# Patient Record
Sex: Female | Born: 1945 | Race: White | Hispanic: No | Smoking: Never smoker
Health system: Southern US, Community
[De-identification: ages and names within clinical notes are randomized; demographics above are authoritative.]

## PROBLEM LIST (undated history)

## (undated) DIAGNOSIS — H04129 Dry eye syndrome of unspecified lacrimal gland: Secondary | ICD-10-CM

## (undated) DIAGNOSIS — I10 Essential (primary) hypertension: Secondary | ICD-10-CM

## (undated) DIAGNOSIS — M79605 Pain in left leg: Secondary | ICD-10-CM

## (undated) DIAGNOSIS — E785 Hyperlipidemia, unspecified: Secondary | ICD-10-CM

## (undated) DIAGNOSIS — N83209 Unspecified ovarian cyst, unspecified side: Secondary | ICD-10-CM

## (undated) DIAGNOSIS — M810 Age-related osteoporosis without current pathological fracture: Secondary | ICD-10-CM

## (undated) HISTORY — DX: Essential (primary) hypertension: I10

## (undated) HISTORY — DX: Hyperlipidemia, unspecified: E78.5

## (undated) HISTORY — DX: Pain in left leg: M79.605

## (undated) HISTORY — DX: Unspecified ovarian cyst, unspecified side: N83.209

## (undated) HISTORY — DX: Age-related osteoporosis without current pathological fracture: M81.0

## (undated) HISTORY — DX: Dry eye syndrome of unspecified lacrimal gland: H04.129

---

## 2013-04-25 DIAGNOSIS — H527 Unspecified disorder of refraction: Secondary | ICD-10-CM | POA: Insufficient documentation

## 2013-04-25 DIAGNOSIS — H2510 Age-related nuclear cataract, unspecified eye: Secondary | ICD-10-CM | POA: Insufficient documentation

## 2013-04-25 DIAGNOSIS — H04129 Dry eye syndrome of unspecified lacrimal gland: Secondary | ICD-10-CM | POA: Insufficient documentation

## 2013-04-25 DIAGNOSIS — G245 Blepharospasm: Secondary | ICD-10-CM | POA: Insufficient documentation

## 2013-04-25 DIAGNOSIS — H1013 Acute atopic conjunctivitis, bilateral: Secondary | ICD-10-CM | POA: Insufficient documentation

## 2013-04-25 DIAGNOSIS — H01006 Unspecified blepharitis left eye, unspecified eyelid: Secondary | ICD-10-CM | POA: Insufficient documentation

## 2013-07-17 DIAGNOSIS — G244 Idiopathic orofacial dystonia: Secondary | ICD-10-CM | POA: Insufficient documentation

## 2014-01-11 DIAGNOSIS — H0100A Unspecified blepharitis right eye, upper and lower eyelids: Secondary | ICD-10-CM | POA: Insufficient documentation

## 2014-02-12 DIAGNOSIS — J329 Chronic sinusitis, unspecified: Secondary | ICD-10-CM | POA: Diagnosis not present

## 2014-02-12 DIAGNOSIS — B349 Viral infection, unspecified: Secondary | ICD-10-CM | POA: Diagnosis not present

## 2014-04-16 DIAGNOSIS — G245 Blepharospasm: Secondary | ICD-10-CM | POA: Diagnosis not present

## 2014-06-28 DIAGNOSIS — H1013 Acute atopic conjunctivitis, bilateral: Secondary | ICD-10-CM | POA: Diagnosis not present

## 2014-06-28 DIAGNOSIS — L249 Irritant contact dermatitis, unspecified cause: Secondary | ICD-10-CM | POA: Diagnosis not present

## 2014-06-28 DIAGNOSIS — G244 Idiopathic orofacial dystonia: Secondary | ICD-10-CM | POA: Diagnosis not present

## 2014-06-28 DIAGNOSIS — H04123 Dry eye syndrome of bilateral lacrimal glands: Secondary | ICD-10-CM | POA: Diagnosis not present

## 2014-06-28 DIAGNOSIS — G245 Blepharospasm: Secondary | ICD-10-CM | POA: Diagnosis not present

## 2014-07-12 DIAGNOSIS — G245 Blepharospasm: Secondary | ICD-10-CM | POA: Diagnosis not present

## 2014-10-11 DIAGNOSIS — G245 Blepharospasm: Secondary | ICD-10-CM | POA: Diagnosis not present

## 2014-11-08 DIAGNOSIS — H01002 Unspecified blepharitis right lower eyelid: Secondary | ICD-10-CM | POA: Diagnosis not present

## 2014-11-08 DIAGNOSIS — H01005 Unspecified blepharitis left lower eyelid: Secondary | ICD-10-CM | POA: Diagnosis not present

## 2014-11-08 DIAGNOSIS — H1013 Acute atopic conjunctivitis, bilateral: Secondary | ICD-10-CM | POA: Diagnosis not present

## 2014-11-08 DIAGNOSIS — H01001 Unspecified blepharitis right upper eyelid: Secondary | ICD-10-CM | POA: Diagnosis not present

## 2014-11-08 DIAGNOSIS — H04123 Dry eye syndrome of bilateral lacrimal glands: Secondary | ICD-10-CM | POA: Diagnosis not present

## 2014-11-08 DIAGNOSIS — H2513 Age-related nuclear cataract, bilateral: Secondary | ICD-10-CM | POA: Diagnosis not present

## 2014-11-08 DIAGNOSIS — H01004 Unspecified blepharitis left upper eyelid: Secondary | ICD-10-CM | POA: Diagnosis not present

## 2014-12-11 DIAGNOSIS — E039 Hypothyroidism, unspecified: Secondary | ICD-10-CM | POA: Diagnosis not present

## 2014-12-11 DIAGNOSIS — R609 Edema, unspecified: Secondary | ICD-10-CM | POA: Diagnosis not present

## 2014-12-11 DIAGNOSIS — J029 Acute pharyngitis, unspecified: Secondary | ICD-10-CM | POA: Diagnosis not present

## 2014-12-11 DIAGNOSIS — Z0131 Encounter for examination of blood pressure with abnormal findings: Secondary | ICD-10-CM | POA: Diagnosis not present

## 2014-12-11 DIAGNOSIS — J019 Acute sinusitis, unspecified: Secondary | ICD-10-CM | POA: Diagnosis not present

## 2014-12-11 DIAGNOSIS — R221 Localized swelling, mass and lump, neck: Secondary | ICD-10-CM | POA: Diagnosis not present

## 2014-12-13 DIAGNOSIS — R221 Localized swelling, mass and lump, neck: Secondary | ICD-10-CM | POA: Diagnosis not present

## 2014-12-27 DIAGNOSIS — R221 Localized swelling, mass and lump, neck: Secondary | ICD-10-CM | POA: Diagnosis not present

## 2015-01-09 DIAGNOSIS — E041 Nontoxic single thyroid nodule: Secondary | ICD-10-CM | POA: Diagnosis not present

## 2015-01-10 DIAGNOSIS — G245 Blepharospasm: Secondary | ICD-10-CM | POA: Diagnosis not present

## 2015-01-28 DIAGNOSIS — I10 Essential (primary) hypertension: Secondary | ICD-10-CM | POA: Diagnosis not present

## 2015-01-28 DIAGNOSIS — E785 Hyperlipidemia, unspecified: Secondary | ICD-10-CM | POA: Diagnosis not present

## 2015-01-29 DIAGNOSIS — E785 Hyperlipidemia, unspecified: Secondary | ICD-10-CM | POA: Diagnosis not present

## 2015-02-10 HISTORY — PX: OTHER SURGICAL HISTORY: SHX169

## 2015-03-19 DIAGNOSIS — J0100 Acute maxillary sinusitis, unspecified: Secondary | ICD-10-CM | POA: Diagnosis not present

## 2015-03-19 DIAGNOSIS — H6501 Acute serous otitis media, right ear: Secondary | ICD-10-CM | POA: Diagnosis not present

## 2015-05-13 DIAGNOSIS — G245 Blepharospasm: Secondary | ICD-10-CM | POA: Diagnosis not present

## 2015-07-17 DIAGNOSIS — I809 Phlebitis and thrombophlebitis of unspecified site: Secondary | ICD-10-CM | POA: Diagnosis not present

## 2015-08-12 DIAGNOSIS — G245 Blepharospasm: Secondary | ICD-10-CM | POA: Diagnosis not present

## 2015-08-19 DIAGNOSIS — R609 Edema, unspecified: Secondary | ICD-10-CM | POA: Diagnosis not present

## 2015-08-20 ENCOUNTER — Telehealth: Payer: Self-pay | Admitting: Family Medicine

## 2015-08-20 NOTE — Telephone Encounter (Signed)
Pt given appt with Dr. Louanne Skyeettinger at 10:25. Pt aware to arrive 30 minutes earlier.

## 2015-08-23 ENCOUNTER — Ambulatory Visit (INDEPENDENT_AMBULATORY_CARE_PROVIDER_SITE_OTHER): Payer: Medicare Other | Admitting: Family Medicine

## 2015-08-23 ENCOUNTER — Encounter: Payer: Self-pay | Admitting: Family Medicine

## 2015-08-23 VITALS — BP 159/90 | HR 88 | Temp 97.6°F | Ht 67.0 in | Wt 157.0 lb

## 2015-08-23 DIAGNOSIS — M79605 Pain in left leg: Secondary | ICD-10-CM | POA: Diagnosis not present

## 2015-08-23 DIAGNOSIS — I10 Essential (primary) hypertension: Secondary | ICD-10-CM | POA: Insufficient documentation

## 2015-08-23 DIAGNOSIS — E785 Hyperlipidemia, unspecified: Secondary | ICD-10-CM | POA: Insufficient documentation

## 2015-08-23 DIAGNOSIS — F411 Generalized anxiety disorder: Secondary | ICD-10-CM | POA: Diagnosis not present

## 2015-08-23 MED ORDER — AMITRIPTYLINE HCL 25 MG PO TABS: 25.0000 mg | ORAL_TABLET | Freq: Every day | ORAL | Status: DC
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
## 2015-08-27 DIAGNOSIS — E041 Nontoxic single thyroid nodule: Secondary | ICD-10-CM | POA: Diagnosis not present

## 2015-08-27 DIAGNOSIS — Z0389 Encounter for observation for other suspected diseases and conditions ruled out: Secondary | ICD-10-CM | POA: Diagnosis not present

## 2015-08-28 ENCOUNTER — Telehealth: Payer: Self-pay | Admitting: Family Medicine

## 2015-08-29 NOTE — Telephone Encounter (Signed)
Saw Dr Dettinger   No referral in place for her leg

## 2015-08-29 NOTE — Telephone Encounter (Signed)
Ultrasound of left leg ordered by Dr. Louanne Skyeettinger. Can you follow up with this please? Thanks

## 2015-08-30 ENCOUNTER — Telehealth: Payer: Self-pay | Admitting: Family Medicine

## 2015-08-30 ENCOUNTER — Ambulatory Visit: Payer: Self-pay | Admitting: Pediatrics

## 2015-09-06 ENCOUNTER — Ambulatory Visit (HOSPITAL_COMMUNITY)
Admission: RE | Admit: 2015-09-06 | Discharge: 2015-09-06 | Disposition: A | Discharge status: Home / Self Care | Payer: Medicare Other | Source: Ambulatory Visit | Attending: Family Medicine | Admitting: Family Medicine

## 2015-09-06 DIAGNOSIS — M79605 Pain in left leg: Secondary | ICD-10-CM | POA: Diagnosis not present

## 2015-09-11 DIAGNOSIS — E042 Nontoxic multinodular goiter: Secondary | ICD-10-CM | POA: Diagnosis not present

## 2015-09-11 DIAGNOSIS — K219 Gastro-esophageal reflux disease without esophagitis: Secondary | ICD-10-CM | POA: Diagnosis not present

## 2015-09-20 ENCOUNTER — Ambulatory Visit (INDEPENDENT_AMBULATORY_CARE_PROVIDER_SITE_OTHER): Payer: Medicare Other | Admitting: Family Medicine

## 2015-09-20 ENCOUNTER — Encounter: Payer: Self-pay | Admitting: Family Medicine

## 2015-09-20 VITALS — BP 158/93 | HR 69 | Temp 97.6°F | Ht 67.0 in | Wt 157.4 lb

## 2015-09-20 DIAGNOSIS — F411 Generalized anxiety disorder: Secondary | ICD-10-CM | POA: Diagnosis not present

## 2015-09-20 DIAGNOSIS — I10 Essential (primary) hypertension: Secondary | ICD-10-CM | POA: Diagnosis not present

## 2015-09-20 DIAGNOSIS — M79605 Pain in left leg: Secondary | ICD-10-CM

## 2015-09-20 MED ORDER — AMITRIPTYLINE HCL 50 MG PO TABS: 50.0000 mg | ORAL_TABLET | Freq: Every day | ORAL | 1 refills | Status: DC

## 2015-09-20 MED ORDER — LISINOPRIL-HYDROCHLOROTHIAZIDE 20-25 MG PO TABS: 1.0000 | ORAL_TABLET | Freq: Every day | ORAL | 1 refills | Status: DC

## 2015-09-20 NOTE — Progress Notes (Signed)
BP (!) 158/93 (BP Location: Left Arm, Patient Position: Sitting, Cuff Size: Normal)   Pulse 69   Temp 97.6 F (36.4 C) (Oral)   Ht '5\' 7"'  (1.702 m)   Wt 157 lb 6.4 oz (71.4 kg)   SpO2 100%   BMI 24.65 kg/m    Subjective:    Patient ID: Vanessa Pitts, female    DOB: 1945/02/16, 70 y.o.   MRN: [id number]  HPI: Vanessa Pitts is a 70 y.o. female presenting on 09/20/2015 for Hypertension   HPI Anxiety and neuropathy leg pain Patient's anxiety has improved much well on Elavil and her neuropathic left lateral leg pain has not improved much either. Her insomnia has improved and she is sleeping a lot better at night. She had the Doppler of her lower leg and did not find any sign of blood clots. The pain is not improved at all and she is wearing compression stockings to help with the swelling. The swelling is minimal while she wears these which she has been wearing them consistently. The anxiety is something that builds up through the day but does not lead to depression or suicidal ideations. She is sleeping better on the Elavil now.  Hypertension recheck Patient's blood pressure today is 158/93. She is currently on hydrochlorothiazide 25 mg. Patient denies headaches, blurred vision, chest pains, shortness of breath, or weakness. Denies any side effects from medication and is content with current medication.   Relevant past medical, surgical, family and social history reviewed and updated as indicated. Interim medical history since our last visit reviewed. Allergies and medications reviewed and updated.  Review of Systems  Constitutional: Negative for chills and fever.  HENT: Negative for congestion, ear discharge and ear pain.   Eyes: Negative for redness and visual disturbance.  Respiratory: Negative for chest tightness and shortness of breath.   Cardiovascular: Positive for leg swelling. Negative for chest pain.  Genitourinary: Negative for difficulty urinating and dysuria.  Musculoskeletal:  Positive for myalgias. Negative for back pain and gait problem.  Skin: Negative for rash.  Neurological: Negative for light-headedness and headaches.  Psychiatric/Behavioral: Negative for agitation, behavioral problems, self-injury, sleep disturbance and suicidal ideas. The patient is nervous/anxious.   All other systems reviewed and are negative.   Per HPI unless specifically indicated above     Medication List       Accurate as of 09/20/15  2:00 PM. Always use your most recent med list.          amitriptyline 50 MG tablet Commonly known as:  ELAVIL Take 1 tablet (50 mg total) by mouth at bedtime.   cyclobenzaprine 10 MG tablet Commonly known as:  FLEXERIL   doxycycline 100 MG tablet Commonly known as:  VIBRA-TABS   ibuprofen 800 MG tablet Commonly known as:  ADVIL,MOTRIN   lisinopril-hydrochlorothiazide 20-25 MG tablet Commonly known as:  PRINZIDE,ZESTORETIC Take 1 tablet by mouth daily.   pravastatin 20 MG tablet Commonly known as:  PRAVACHOL Take 20 mg by mouth.   ranitidine 300 MG tablet Commonly known as:  ZANTAC Take 300 mg by mouth 2 (two) times daily.          Objective:    BP (!) 158/93 (BP Location: Left Arm, Patient Position: Sitting, Cuff Size: Normal)   Pulse 69   Temp 97.6 F (36.4 C) (Oral)   Ht '5\' 7"'  (1.702 m)   Wt 157 lb 6.4 oz (71.4 kg)   SpO2 100%   BMI 24.65 kg/m   Wt Readings  from Last 3 Encounters:  09/20/15 157 lb 6.4 oz (71.4 kg)  08/23/15 157 lb (71.2 kg)    Physical Exam  Constitutional: She is oriented to person, place, and time. She appears well-developed and well-nourished. No distress.  Eyes: Conjunctivae and EOM are normal. Pupils are equal, round, and reactive to light.  Cardiovascular: Normal rate, regular rhythm, normal heart sounds and intact distal pulses.   No murmur heard. Pulmonary/Chest: Effort normal and breath sounds normal. No respiratory distress. She has no wheezes.  Musculoskeletal: Normal range of  motion. She exhibits edema (Trace edema noticed today but patient has been wearing compression stocking). She exhibits no tenderness (No tenderness on palpation anywhere on the left lower extremity currently).  Neurological: She is alert and oriented to person, place, and time. Coordination normal.  Skin: Skin is warm and dry. No rash noted. She is not diaphoretic.  Psychiatric: Her behavior is normal. Thought content normal. Her mood appears anxious. She does not exhibit a depressed mood. She expresses no suicidal ideation. She expresses no suicidal plans.  Nursing note and vitals reviewed.   No results found for this or any previous visit.    Assessment & Plan:   Problem List Items Addressed This Visit      Cardiovascular and Mediastinum   HTN (hypertension) - Primary   Relevant Medications   lisinopril-hydrochlorothiazide (PRINZIDE,ZESTORETIC) 20-25 MG tablet   Other Relevant Orders   CBC with Differential/Platelet   CMP14+EGFR   Lipid panel     Other   Generalized anxiety disorder   Relevant Medications   amitriptyline (ELAVIL) 50 MG tablet   Other Relevant Orders   CBC with Differential/Platelet    Other Visit Diagnoses    Left leg pain       Concern for neuropathic pain versus varicose veins, patient wants an ultrasound to rule out blood clots   Relevant Medications   amitriptyline (ELAVIL) 50 MG tablet   Other Relevant Orders   CMP14+EGFR   Vitamin B12   VITAMIN D 25 Hydroxy (Vit-D Deficiency, Fractures)   Magnesium       Follow up plan: Return in about 4 weeks (around 10/18/2015), or if symptoms worsen or fail to improve, for Recheck hypertension and anxiety and leg pain.  Counseling provided for all of the vaccine components Orders Placed This Encounter  Procedures  . CBC with Differential/Platelet  . CMP14+EGFR  . Lipid panel  . Vitamin B12  . VITAMIN D 25 Hydroxy (Vit-D Deficiency, Fractures)  . Magnesium    Caryl Pina, MD [facility] 09/20/2015, 2:00 PM

## 2015-09-21 ENCOUNTER — Other Ambulatory Visit: Payer: Medicare Other

## 2015-09-21 DIAGNOSIS — M79605 Pain in left leg: Secondary | ICD-10-CM | POA: Diagnosis not present

## 2015-09-21 DIAGNOSIS — R6889 Other general symptoms and signs: Secondary | ICD-10-CM | POA: Diagnosis not present

## 2015-09-21 DIAGNOSIS — F411 Generalized anxiety disorder: Secondary | ICD-10-CM

## 2015-09-21 DIAGNOSIS — I10 Essential (primary) hypertension: Secondary | ICD-10-CM

## 2015-09-23 LAB — CMP14+EGFR
ALBUMIN: 4.5 g/dL (ref 3.6–4.8)
ALK PHOS: 62 IU/L (ref 39–117)
ALT: 8 IU/L (ref 0–32)
AST: 19 IU/L (ref 0–40)
Albumin/Globulin Ratio: 2 (ref 1.2–2.2)
BILIRUBIN TOTAL: 0.4 mg/dL (ref 0.0–1.2)
BUN / CREAT RATIO: 20 (ref 12–28)
BUN: 21 mg/dL (ref 8–27)
CHLORIDE: 94 mmol/L — AB (ref 96–106)
CO2: 26 mmol/L (ref 18–29)
Calcium: 9.7 mg/dL (ref 8.7–10.3)
Creatinine, Ser: 1.07 mg/dL — ABNORMAL HIGH (ref 0.57–1.00)
GFR calc Af Amer: 61 mL/min/{1.73_m2} (ref >59)
GFR calc non Af Amer: 53 mL/min/{1.73_m2} — ABNORMAL LOW (ref >59)
Globulin, Total: 2.3 g/dL (ref 1.5–4.5)
Glucose: 95 mg/dL (ref 65–99)
Potassium: 4.5 mmol/L (ref 3.5–5.2)
SODIUM: 136 mmol/L (ref 134–144)
Total Protein: 6.8 g/dL (ref 6.0–8.5)

## 2015-09-23 LAB — LIPID PANEL
CHOLESTEROL TOTAL: 205 mg/dL — AB (ref 100–199)
Chol/HDL Ratio: 2.7 ratio units (ref 0.0–4.4)
HDL: 76 mg/dL (ref >39)
LDL Calculated: 114 mg/dL — ABNORMAL HIGH (ref 0–99)
TRIGLYCERIDES: 76 mg/dL (ref 0–149)
VLDL Cholesterol Cal: 15 mg/dL (ref 5–40)

## 2015-09-23 LAB — CBC WITH DIFFERENTIAL/PLATELET
BASOS ABS: 0 10*3/uL (ref 0.0–0.2)
Basos: 1 %
EOS (ABSOLUTE): 0.1 10*3/uL (ref 0.0–0.4)
Eos: 2 %
HEMATOCRIT: 38.9 % (ref 34.0–46.6)
HEMOGLOBIN: 12.9 g/dL (ref 11.1–15.9)
Immature Grans (Abs): 0 10*3/uL (ref 0.0–0.1)
Immature Granulocytes: 0 %
LYMPHS ABS: 1.6 10*3/uL (ref 0.7–3.1)
Lymphs: 45 %
MCH: 28.4 pg (ref 26.6–33.0)
MCHC: 33.2 g/dL (ref 31.5–35.7)
MCV: 86 fL (ref 79–97)
MONOCYTES: 12 %
MONOS ABS: 0.5 10*3/uL (ref 0.1–0.9)
NEUTROS ABS: 1.4 10*3/uL (ref 1.4–7.0)
Neutrophils: 40 %
Platelets: 274 10*3/uL (ref 150–379)
RBC: 4.54 x10E6/uL (ref 3.77–5.28)
RDW: 14.3 % (ref 12.3–15.4)
WBC: 3.6 10*3/uL (ref 3.4–10.8)

## 2015-09-23 LAB — VITAMIN B12: Vitamin B-12: >2000 pg/mL — ABNORMAL HIGH (ref 211–946)

## 2015-09-23 LAB — MAGNESIUM: Magnesium: 2.1 mg/dL (ref 1.6–2.3)

## 2015-09-23 LAB — VITAMIN D 25 HYDROXY (VIT D DEFICIENCY, FRACTURES): VIT D 25 HYDROXY: 42.9 ng/mL (ref 30.0–100.0)

## 2015-10-02 ENCOUNTER — Encounter: Payer: Self-pay | Admitting: Family Medicine

## 2015-10-18 ENCOUNTER — Ambulatory Visit: Payer: Medicare Other | Admitting: Family Medicine

## 2015-10-24 ENCOUNTER — Encounter: Payer: Self-pay | Admitting: Family Medicine

## 2015-10-24 ENCOUNTER — Ambulatory Visit (INDEPENDENT_AMBULATORY_CARE_PROVIDER_SITE_OTHER): Payer: Medicare Other | Admitting: Family Medicine

## 2015-10-24 VITALS — BP 153/84 | HR 92 | Temp 97.5°F | Ht 67.0 in | Wt 159.1 lb

## 2015-10-24 DIAGNOSIS — I10 Essential (primary) hypertension: Secondary | ICD-10-CM | POA: Diagnosis not present

## 2015-10-24 DIAGNOSIS — F411 Generalized anxiety disorder: Secondary | ICD-10-CM | POA: Diagnosis not present

## 2015-10-24 DIAGNOSIS — M1712 Unilateral primary osteoarthritis, left knee: Secondary | ICD-10-CM | POA: Diagnosis not present

## 2015-10-24 MED ORDER — AMITRIPTYLINE HCL 75 MG PO TABS: 75.0000 mg | ORAL_TABLET | Freq: Every day | ORAL | 2 refills | Status: DC

## 2015-10-24 MED ORDER — METHYLPREDNISOLONE ACETATE 80 MG/ML IJ SUSP
80.0000 mg | Freq: Once | INTRAMUSCULAR | Status: AC
  Administered 2015-10-24: 80 mg via INTRAMUSCULAR

## 2015-10-24 NOTE — Assessment & Plan Note (Signed)
Leg pain and sleeping is improved, we'll go up to see if we can help a little bit more with anxiety

## 2015-10-24 NOTE — Progress Notes (Addendum)
BP (!) 153/84 (BP Location: Left Arm, Cuff Size: Large)   Pulse 92   Temp 97.5 F (36.4 C) (Oral)   Ht 5\' 7"  (1.702 m)   Wt 159 lb 2 oz (72.2 kg)   BMI 24.92 kg/m    Subjective:    Patient ID: Vanessa Pitts, female    DOB: 05/12/1945, 70 y.o.   MRN: [id number]  HPI: Vanessa Pitts is a 70 y.o. female presenting on 10/24/2015 for Hypertension (4 week followup); Anxiety; and Left leg pain (patient reports leg is still hurting, taking Flexeril but only at night because it makes her drowsy)   HPI Left knee pain Patient has been having known left knee and left leg pain. Initially she was having some neuropathy and swelling and we started her on Elavil to help with both anxiety and depression and help with the pain. Now she is having what she says is more centralized pain in that left knee. Her leg pain has improved but the knee itself is hurting her. She has known that she has arthritis in that knee previously but feels like it is worsened. The pain is worse on the medial aspect of the knee and lateral. Pain is worse with ambulation or prolonged standing. The pain is starting to affect her at night while she sleeps sometimes.  Anxiety Patient says that the Elavil that we put her on is helping with her insomnia and her leg pains but does not feel like it is helping too much with her anxiety yet. She is currently on 50 mg daily at bedtime. She denies any major side effects or any somnolence early in the morning because of this dose. She would like to try and go up to see if helps more. She denies any suicidal ideations or thoughts of hurting herself.  Hypertension recheck Patient comes in today for hypertension recheck as well. Her blood pressure is 153/84. She is currently on lisinopril hydrochlorothiazide combination. She admits that she did not take her blood pressure medication this morning and that may be why it's elevated. Patient denies headaches, blurred vision, chest pains, shortness of  breath, or weakness. Denies any side effects from medication and is content with current medication.   Relevant past medical, surgical, family and social history reviewed and updated as indicated. Interim medical history since our last visit reviewed. Allergies and medications reviewed and updated.  Review of Systems  Constitutional: Negative for chills and fever.  HENT: Negative for congestion, ear discharge and ear pain.   Eyes: Negative for redness and visual disturbance.  Respiratory: Negative for chest tightness and shortness of breath.   Cardiovascular: Negative for chest pain and leg swelling.  Genitourinary: Negative for difficulty urinating and dysuria.  Musculoskeletal: Positive for arthralgias and joint swelling. Negative for back pain and gait problem.  Skin: Negative for color change and rash.  Neurological: Negative for dizziness, light-headedness and headaches.  Psychiatric/Behavioral: Positive for dysphoric mood and sleep disturbance. Negative for agitation, behavioral problems, self-injury and suicidal ideas. The patient is nervous/anxious.   All other systems reviewed and are negative.   Per HPI unless specifically indicated above     Medication List       Accurate as of 10/24/15  2:02 PM. Always use your most recent med list.          amitriptyline 75 MG tablet Commonly known as:  ELAVIL Take 1 tablet (75 mg total) by mouth at bedtime.   cyclobenzaprine 10 MG tablet Commonly known as:  FLEXERIL   doxycycline 100 MG tablet Commonly known as:  VIBRA-TABS   ibuprofen 800 MG tablet Commonly known as:  ADVIL,MOTRIN   lisinopril-hydrochlorothiazide 20-25 MG tablet Commonly known as:  PRINZIDE,ZESTORETIC Take 1 tablet by mouth daily.   pravastatin 20 MG tablet Commonly known as:  PRAVACHOL Take 20 mg by mouth.   ranitidine 300 MG tablet Commonly known as:  ZANTAC Take 300 mg by mouth 2 (two) times daily.          Objective:    BP (!) 153/84 (BP  Location: Left Arm, Cuff Size: Large)   Pulse 92   Temp 97.5 F (36.4 C) (Oral)   Ht 5\' 7"  (1.702 m)   Wt 159 lb 2 oz (72.2 kg)   BMI 24.92 kg/m   Wt Readings from Last 3 Encounters:  10/24/15 159 lb 2 oz (72.2 kg)  09/20/15 157 lb 6.4 oz (71.4 kg)  08/23/15 157 lb (71.2 kg)    Physical Exam  Constitutional: She is oriented to person, place, and time. She appears well-developed and well-nourished. No distress.  Eyes: Conjunctivae are normal.  Cardiovascular: Normal rate, regular rhythm, normal heart sounds and intact distal pulses.   No murmur heard. Pulmonary/Chest: Effort normal and breath sounds normal. No respiratory distress. She has no wheezes.  Musculoskeletal: Normal range of motion. She exhibits no edema.       Left knee: She exhibits normal range of motion, no swelling, no deformity, no erythema, normal alignment, no LCL laxity, normal patellar mobility, no bony tenderness, normal meniscus and no MCL laxity. Tenderness found. Medial joint line tenderness noted.  Neurological: She is alert and oriented to person, place, and time. Coordination normal.  Skin: Skin is warm and dry. No rash noted. She is not diaphoretic.  Psychiatric: Her behavior is normal. Judgment and thought content normal. Her mood appears anxious. She exhibits a depressed mood. She expresses no suicidal ideation. She expresses no suicidal plans.  Nursing note and vitals reviewed.  Knee injection: Risk factors of bleeding and infection discussed with patient and patient is agreeable towards injection. Patient prepped with Betadine. Lateral approach towards injection used. Injected 80 mg of Depo-Medrol and 1 mL of 2% lidocaine. Patient tolerated procedure well and no side effects from noted. Minimal to no bleeding. Simple bandage applied after.    Assessment & Plan:   Problem List Items Addressed This Visit      Cardiovascular and Mediastinum   HTN (hypertension) - Primary    Patient in pain today, will  continue to monitor for now.      Relevant Medications   methylPREDNISolone acetate (DEPO-MEDROL) injection 80 mg (Completed)     Other   Generalized anxiety disorder    Leg pain and sleeping is improved, we'll go up to see if we can help a little bit more with anxiety      Relevant Medications   amitriptyline (ELAVIL) 75 MG tablet    Other Visit Diagnoses    Primary osteoarthritis of left knee       We'll do injection because of arthritis and see if that helps her ambulate better   Relevant Medications   methylPREDNISolone acetate (DEPO-MEDROL) injection 80 mg (Completed)       Follow up plan: Return in about 2 months (around 12/24/2015), or if symptoms worsen or fail to improve, for Follow-up anxiety and hypertension.  Counseling provided for all of the vaccine components No orders of the defined types were placed in this encounter.  Arville Care, MD [facility] 10/24/2015, 2:02 PM

## 2015-10-24 NOTE — Assessment & Plan Note (Signed)
Patient in pain today, will continue to monitor for now.

## 2015-11-07 DIAGNOSIS — H0102B Squamous blepharitis left eye, upper and lower eyelids: Secondary | ICD-10-CM

## 2015-11-07 DIAGNOSIS — H0289 Other specified disorders of eyelid: Secondary | ICD-10-CM | POA: Diagnosis not present

## 2015-11-07 DIAGNOSIS — I10 Essential (primary) hypertension: Secondary | ICD-10-CM | POA: Diagnosis not present

## 2015-11-07 DIAGNOSIS — H25813 Combined forms of age-related cataract, bilateral: Secondary | ICD-10-CM | POA: Diagnosis not present

## 2015-11-07 DIAGNOSIS — H04123 Dry eye syndrome of bilateral lacrimal glands: Secondary | ICD-10-CM | POA: Diagnosis not present

## 2015-11-07 DIAGNOSIS — H0102A Squamous blepharitis right eye, upper and lower eyelids: Secondary | ICD-10-CM | POA: Insufficient documentation

## 2015-11-07 DIAGNOSIS — H01021 Squamous blepharitis right upper eyelid: Secondary | ICD-10-CM | POA: Diagnosis not present

## 2015-11-07 DIAGNOSIS — H02831 Dermatochalasis of right upper eyelid: Secondary | ICD-10-CM | POA: Diagnosis not present

## 2015-11-07 DIAGNOSIS — H01025 Squamous blepharitis left lower eyelid: Secondary | ICD-10-CM | POA: Diagnosis not present

## 2015-11-07 DIAGNOSIS — H2513 Age-related nuclear cataract, bilateral: Secondary | ICD-10-CM | POA: Diagnosis not present

## 2015-11-07 DIAGNOSIS — H524 Presbyopia: Secondary | ICD-10-CM | POA: Diagnosis not present

## 2015-11-07 DIAGNOSIS — H02834 Dermatochalasis of left upper eyelid: Secondary | ICD-10-CM | POA: Diagnosis not present

## 2015-11-07 DIAGNOSIS — H5213 Myopia, bilateral: Secondary | ICD-10-CM | POA: Diagnosis not present

## 2015-11-07 DIAGNOSIS — H1010 Acute atopic conjunctivitis, unspecified eye: Secondary | ICD-10-CM | POA: Diagnosis not present

## 2015-11-07 DIAGNOSIS — H52203 Unspecified astigmatism, bilateral: Secondary | ICD-10-CM | POA: Diagnosis not present

## 2015-11-07 DIAGNOSIS — H01024 Squamous blepharitis left upper eyelid: Secondary | ICD-10-CM | POA: Diagnosis not present

## 2015-11-07 DIAGNOSIS — H52202 Unspecified astigmatism, left eye: Secondary | ICD-10-CM | POA: Diagnosis not present

## 2015-11-07 DIAGNOSIS — G245 Blepharospasm: Secondary | ICD-10-CM | POA: Diagnosis not present

## 2015-11-07 DIAGNOSIS — H01022 Squamous blepharitis right lower eyelid: Secondary | ICD-10-CM | POA: Diagnosis not present

## 2015-11-07 DIAGNOSIS — Z79899 Other long term (current) drug therapy: Secondary | ICD-10-CM | POA: Diagnosis not present

## 2015-11-11 DIAGNOSIS — G245 Blepharospasm: Secondary | ICD-10-CM | POA: Diagnosis not present

## 2015-12-03 ENCOUNTER — Encounter: Payer: Medicare Other | Admitting: *Deleted

## 2015-12-03 DIAGNOSIS — Z1231 Encounter for screening mammogram for malignant neoplasm of breast: Secondary | ICD-10-CM | POA: Diagnosis not present

## 2015-12-05 ENCOUNTER — Other Ambulatory Visit: Payer: Self-pay | Admitting: Family Medicine

## 2015-12-05 DIAGNOSIS — I10 Essential (primary) hypertension: Secondary | ICD-10-CM

## 2015-12-24 ENCOUNTER — Encounter: Payer: Self-pay | Admitting: Family Medicine

## 2015-12-24 ENCOUNTER — Ambulatory Visit (INDEPENDENT_AMBULATORY_CARE_PROVIDER_SITE_OTHER): Payer: Medicare Other

## 2015-12-24 ENCOUNTER — Ambulatory Visit (INDEPENDENT_AMBULATORY_CARE_PROVIDER_SITE_OTHER): Payer: Medicare Other | Admitting: Family Medicine

## 2015-12-24 ENCOUNTER — Encounter (INDEPENDENT_AMBULATORY_CARE_PROVIDER_SITE_OTHER): Payer: Self-pay

## 2015-12-24 VITALS — BP 138/89 | HR 84 | Temp 97.6°F | Ht 67.0 in | Wt 156.4 lb

## 2015-12-24 DIAGNOSIS — I10 Essential (primary) hypertension: Secondary | ICD-10-CM | POA: Diagnosis not present

## 2015-12-24 DIAGNOSIS — F411 Generalized anxiety disorder: Secondary | ICD-10-CM

## 2015-12-24 DIAGNOSIS — M79605 Pain in left leg: Secondary | ICD-10-CM

## 2015-12-24 MED ORDER — AMITRIPTYLINE HCL 50 MG PO TABS: 50.0000 mg | ORAL_TABLET | Freq: Every day | ORAL | 2 refills | Status: DC

## 2015-12-24 MED ORDER — BUPROPION HCL ER (XL) 150 MG PO TB24: 150.0000 mg | ORAL_TABLET | Freq: Every day | ORAL | 2 refills | Status: DC

## 2015-12-24 NOTE — Progress Notes (Signed)
BP 138/89   Pulse 84   Temp 97.6 F (36.4 C) (Oral)   Ht 5\' 7"  (1.702 m)   Wt 156 lb 6 oz (70.9 kg)   BMI 24.49 kg/m    Subjective:    Patient ID: Vanessa Pitts, female    DOB: 06/14/1945, 70 y.o.   MRN: [id number]  HPI: Vanessa Pitts is a 70 y.o. female presenting on 12/24/2015 for Hypertension (2 month followup); Anxiety (reports Amitriptyline is too strong, is still sleepy the next day); and Left leg pain (leg is still bothering her)   HPI Hypertension recheck. Patient is coming in today for hypertension recheck. Her blood pressure today is 138/89. Much improved from previous. She is on lisinopril-hydrochlorothiazide Patient denies headaches, blurred vision, chest pains, shortness of breath, or weakness. Denies any side effects from medication and is content with current medication.   Anxiety and difficulty sleeping Patient has been having anxiety and difficulty sleeping and was increased on her last visit from amitriptyline 50 mg to 75 mg. She says a 75 mg is too strong for her and she has daytime somnolence and can't get up and get going. She says it does still help with her sleeping but did not help much more with her anxiety so she stopped taking that dose and went back on the 50 mg. She says it has not helped much with the pain in her left leg that she has had chronically. She denies any suicidal ideations or thoughts. Herself. She says mostly she just gets anxious about social situations and getting out and doing things.  Left leg pain chronic Patient has chronic left lateral lower leg pain that sometimes comes over the top of her left foot near her ankle on that side. She has had this intermittently for most of this year if not longer. She had an ultrasound that ruled out DVT in that leg. She denies any numbness or weakness in the leg. She has had some swelling in that leg. She has been wearing compression stockings to help. She rates the pain at a 6 out of 10.  Relevant past  medical, surgical, family and social history reviewed and updated as indicated. Interim medical history since our last visit reviewed. Allergies and medications reviewed and updated.  Review of Systems  Constitutional: Negative for chills and fever.  HENT: Negative for congestion, ear discharge and ear pain.   Eyes: Negative for redness and visual disturbance.  Respiratory: Negative for chest tightness and shortness of breath.   Cardiovascular: Negative for chest pain and leg swelling.  Genitourinary: Negative for difficulty urinating and dysuria.  Musculoskeletal: Positive for myalgias. Negative for back pain and gait problem.  Skin: Negative for color change, rash and wound.  Neurological: Negative for light-headedness and headaches.  Psychiatric/Behavioral: Positive for sleep disturbance. Negative for agitation, behavioral problems, dysphoric mood, self-injury and suicidal ideas. The patient is nervous/anxious.   All other systems reviewed and are negative.   Per HPI unless specifically indicated above      Objective:    BP 138/89   Pulse 84   Temp 97.6 F (36.4 C) (Oral)   Ht 5\' 7"  (1.702 m)   Wt 156 lb 6 oz (70.9 kg)   BMI 24.49 kg/m   Wt Readings from Last 3 Encounters:  12/24/15 156 lb 6 oz (70.9 kg)  10/24/15 159 lb 2 oz (72.2 kg)  09/20/15 157 lb 6.4 oz (71.4 kg)    Physical Exam  Constitutional: She is oriented to  person, place, and time. She appears well-developed and well-nourished. No distress.  Eyes: Conjunctivae are normal.  Cardiovascular: Normal rate, regular rhythm, normal heart sounds and intact distal pulses.   No murmur heard. Pulmonary/Chest: Effort normal and breath sounds normal. No respiratory distress. She has no wheezes. She has no rales.  Musculoskeletal: Normal range of motion. She exhibits edema (Trace edema.). She exhibits no tenderness (No tenderness to palpation.).  Neurological: She is alert and oriented to person, place, and time.  Coordination normal.  Skin: Skin is warm and dry. No rash noted. She is not diaphoretic.  Psychiatric: She has a normal mood and affect. Her behavior is normal.  Nursing note and vitals reviewed.     Assessment & Plan:   Problem List Items Addressed This Visit      Cardiovascular and Mediastinum   HTN (hypertension)    Controlled, continue current medication        Other   Generalized anxiety disorder - Primary   Relevant Medications   buPROPion (WELLBUTRIN XL) 150 MG 24 hr tablet   amitriptyline (ELAVIL) 50 MG tablet    Other Visit Diagnoses    Left leg pain       Chronic lateral lower left leg pain, likely from swelling, will do x-ray to make sure there are no spinal issues   Relevant Orders   DG Lumbar Spine 2-3 Views       Follow up plan: Return in about 2 months (around 02/23/2016), or if symptoms worsen or fail to improve, for Follow-up anxiety and hypertension.  Counseling provided for all of the vaccine components Orders Placed This Encounter  Procedures  . DG Lumbar Spine 2-3 Views    Arville CareJoshua Orissa Arreaga, MD [facility] 12/24/2015, 2:24 PM

## 2015-12-24 NOTE — Assessment & Plan Note (Signed)
Controlled, continue current medication 

## 2016-01-09 DIAGNOSIS — H2512 Age-related nuclear cataract, left eye: Secondary | ICD-10-CM | POA: Diagnosis not present

## 2016-01-09 DIAGNOSIS — H2513 Age-related nuclear cataract, bilateral: Secondary | ICD-10-CM | POA: Diagnosis not present

## 2016-01-09 DIAGNOSIS — Z79899 Other long term (current) drug therapy: Secondary | ICD-10-CM | POA: Diagnosis not present

## 2016-01-09 DIAGNOSIS — I10 Essential (primary) hypertension: Secondary | ICD-10-CM | POA: Diagnosis not present

## 2016-01-15 DIAGNOSIS — M1990 Unspecified osteoarthritis, unspecified site: Secondary | ICD-10-CM | POA: Diagnosis not present

## 2016-01-15 DIAGNOSIS — H25812 Combined forms of age-related cataract, left eye: Secondary | ICD-10-CM | POA: Diagnosis not present

## 2016-01-15 DIAGNOSIS — H25813 Combined forms of age-related cataract, bilateral: Secondary | ICD-10-CM | POA: Diagnosis not present

## 2016-01-15 DIAGNOSIS — E785 Hyperlipidemia, unspecified: Secondary | ICD-10-CM | POA: Diagnosis not present

## 2016-01-15 DIAGNOSIS — Z79899 Other long term (current) drug therapy: Secondary | ICD-10-CM | POA: Diagnosis not present

## 2016-01-15 DIAGNOSIS — I10 Essential (primary) hypertension: Secondary | ICD-10-CM | POA: Diagnosis not present

## 2016-01-15 DIAGNOSIS — H1013 Acute atopic conjunctivitis, bilateral: Secondary | ICD-10-CM | POA: Diagnosis not present

## 2016-01-16 DIAGNOSIS — H01022 Squamous blepharitis right lower eyelid: Secondary | ICD-10-CM | POA: Diagnosis not present

## 2016-01-16 DIAGNOSIS — H524 Presbyopia: Secondary | ICD-10-CM | POA: Diagnosis not present

## 2016-01-16 DIAGNOSIS — H01021 Squamous blepharitis right upper eyelid: Secondary | ICD-10-CM | POA: Diagnosis not present

## 2016-01-16 DIAGNOSIS — G245 Blepharospasm: Secondary | ICD-10-CM | POA: Diagnosis not present

## 2016-01-16 DIAGNOSIS — H25813 Combined forms of age-related cataract, bilateral: Secondary | ICD-10-CM | POA: Diagnosis not present

## 2016-01-16 DIAGNOSIS — H1013 Acute atopic conjunctivitis, bilateral: Secondary | ICD-10-CM | POA: Diagnosis not present

## 2016-01-16 DIAGNOSIS — H01025 Squamous blepharitis left lower eyelid: Secondary | ICD-10-CM | POA: Diagnosis not present

## 2016-01-16 DIAGNOSIS — H01024 Squamous blepharitis left upper eyelid: Secondary | ICD-10-CM | POA: Diagnosis not present

## 2016-01-16 DIAGNOSIS — H04123 Dry eye syndrome of bilateral lacrimal glands: Secondary | ICD-10-CM | POA: Diagnosis not present

## 2016-01-16 DIAGNOSIS — H52203 Unspecified astigmatism, bilateral: Secondary | ICD-10-CM | POA: Diagnosis not present

## 2016-01-29 DIAGNOSIS — Z961 Presence of intraocular lens: Secondary | ICD-10-CM | POA: Diagnosis not present

## 2016-01-29 DIAGNOSIS — H25811 Combined forms of age-related cataract, right eye: Secondary | ICD-10-CM | POA: Diagnosis not present

## 2016-01-30 DIAGNOSIS — Z9842 Cataract extraction status, left eye: Secondary | ICD-10-CM | POA: Diagnosis not present

## 2016-01-30 DIAGNOSIS — H0289 Other specified disorders of eyelid: Secondary | ICD-10-CM | POA: Diagnosis not present

## 2016-01-30 DIAGNOSIS — H1013 Acute atopic conjunctivitis, bilateral: Secondary | ICD-10-CM | POA: Diagnosis not present

## 2016-01-30 DIAGNOSIS — H5213 Myopia, bilateral: Secondary | ICD-10-CM | POA: Diagnosis not present

## 2016-01-30 DIAGNOSIS — H01022 Squamous blepharitis right lower eyelid: Secondary | ICD-10-CM | POA: Diagnosis not present

## 2016-01-30 DIAGNOSIS — G245 Blepharospasm: Secondary | ICD-10-CM | POA: Diagnosis not present

## 2016-01-30 DIAGNOSIS — H524 Presbyopia: Secondary | ICD-10-CM | POA: Diagnosis not present

## 2016-01-30 DIAGNOSIS — H04123 Dry eye syndrome of bilateral lacrimal glands: Secondary | ICD-10-CM | POA: Diagnosis not present

## 2016-01-30 DIAGNOSIS — Z961 Presence of intraocular lens: Secondary | ICD-10-CM | POA: Diagnosis not present

## 2016-01-30 DIAGNOSIS — H02834 Dermatochalasis of left upper eyelid: Secondary | ICD-10-CM | POA: Diagnosis not present

## 2016-01-30 DIAGNOSIS — H02831 Dermatochalasis of right upper eyelid: Secondary | ICD-10-CM | POA: Diagnosis not present

## 2016-01-30 DIAGNOSIS — H02813 Retained foreign body in right eye, unspecified eyelid: Secondary | ICD-10-CM | POA: Diagnosis not present

## 2016-01-30 DIAGNOSIS — H01024 Squamous blepharitis left upper eyelid: Secondary | ICD-10-CM | POA: Diagnosis not present

## 2016-01-30 DIAGNOSIS — Z9889 Other specified postprocedural states: Secondary | ICD-10-CM | POA: Diagnosis not present

## 2016-01-30 DIAGNOSIS — Z79899 Other long term (current) drug therapy: Secondary | ICD-10-CM | POA: Diagnosis not present

## 2016-01-30 DIAGNOSIS — I781 Nevus, non-neoplastic: Secondary | ICD-10-CM | POA: Diagnosis not present

## 2016-01-30 DIAGNOSIS — H52202 Unspecified astigmatism, left eye: Secondary | ICD-10-CM | POA: Diagnosis not present

## 2016-01-30 DIAGNOSIS — Z9841 Cataract extraction status, right eye: Secondary | ICD-10-CM | POA: Diagnosis not present

## 2016-01-30 DIAGNOSIS — H01025 Squamous blepharitis left lower eyelid: Secondary | ICD-10-CM | POA: Diagnosis not present

## 2016-01-30 DIAGNOSIS — H01021 Squamous blepharitis right upper eyelid: Secondary | ICD-10-CM | POA: Diagnosis not present

## 2016-01-30 DIAGNOSIS — I10 Essential (primary) hypertension: Secondary | ICD-10-CM | POA: Diagnosis not present

## 2016-02-09 DIAGNOSIS — Z9889 Other specified postprocedural states: Secondary | ICD-10-CM | POA: Diagnosis not present

## 2016-02-09 DIAGNOSIS — R404 Transient alteration of awareness: Secondary | ICD-10-CM | POA: Diagnosis not present

## 2016-02-09 DIAGNOSIS — R079 Chest pain, unspecified: Secondary | ICD-10-CM | POA: Diagnosis not present

## 2016-02-09 DIAGNOSIS — R55 Syncope and collapse: Secondary | ICD-10-CM | POA: Diagnosis not present

## 2016-02-09 DIAGNOSIS — H04123 Dry eye syndrome of bilateral lacrimal glands: Secondary | ICD-10-CM | POA: Diagnosis not present

## 2016-02-09 DIAGNOSIS — I10 Essential (primary) hypertension: Secondary | ICD-10-CM | POA: Diagnosis not present

## 2016-02-09 DIAGNOSIS — D72829 Elevated white blood cell count, unspecified: Secondary | ICD-10-CM | POA: Diagnosis not present

## 2016-02-09 DIAGNOSIS — R0789 Other chest pain: Secondary | ICD-10-CM | POA: Diagnosis not present

## 2016-02-09 DIAGNOSIS — R918 Other nonspecific abnormal finding of lung field: Secondary | ICD-10-CM | POA: Diagnosis not present

## 2016-02-09 DIAGNOSIS — Z79899 Other long term (current) drug therapy: Secondary | ICD-10-CM | POA: Diagnosis not present

## 2016-02-09 DIAGNOSIS — E860 Dehydration: Secondary | ICD-10-CM | POA: Diagnosis not present

## 2016-02-09 DIAGNOSIS — F332 Major depressive disorder, recurrent severe without psychotic features: Secondary | ICD-10-CM | POA: Diagnosis not present

## 2016-02-09 DIAGNOSIS — R159 Full incontinence of feces: Secondary | ICD-10-CM | POA: Diagnosis not present

## 2016-02-09 DIAGNOSIS — G4700 Insomnia, unspecified: Secondary | ICD-10-CM | POA: Diagnosis not present

## 2016-02-09 DIAGNOSIS — R7989 Other specified abnormal findings of blood chemistry: Secondary | ICD-10-CM | POA: Diagnosis not present

## 2016-02-09 DIAGNOSIS — I482 Chronic atrial fibrillation: Secondary | ICD-10-CM | POA: Diagnosis not present

## 2016-02-10 DIAGNOSIS — R55 Syncope and collapse: Secondary | ICD-10-CM | POA: Diagnosis not present

## 2016-02-10 DIAGNOSIS — R159 Full incontinence of feces: Secondary | ICD-10-CM | POA: Diagnosis not present

## 2016-02-10 DIAGNOSIS — E860 Dehydration: Secondary | ICD-10-CM | POA: Diagnosis not present

## 2016-02-10 DIAGNOSIS — R7989 Other specified abnormal findings of blood chemistry: Secondary | ICD-10-CM | POA: Diagnosis not present

## 2016-02-10 DIAGNOSIS — Z9889 Other specified postprocedural states: Secondary | ICD-10-CM | POA: Diagnosis not present

## 2016-02-10 DIAGNOSIS — D72829 Elevated white blood cell count, unspecified: Secondary | ICD-10-CM | POA: Diagnosis not present

## 2016-02-10 DIAGNOSIS — G4700 Insomnia, unspecified: Secondary | ICD-10-CM | POA: Diagnosis not present

## 2016-02-10 DIAGNOSIS — R0789 Other chest pain: Secondary | ICD-10-CM | POA: Diagnosis not present

## 2016-02-10 DIAGNOSIS — F332 Major depressive disorder, recurrent severe without psychotic features: Secondary | ICD-10-CM | POA: Diagnosis not present

## 2016-02-10 DIAGNOSIS — I10 Essential (primary) hypertension: Secondary | ICD-10-CM | POA: Diagnosis not present

## 2016-02-10 DIAGNOSIS — Z79899 Other long term (current) drug therapy: Secondary | ICD-10-CM | POA: Diagnosis not present

## 2016-02-10 DIAGNOSIS — H04123 Dry eye syndrome of bilateral lacrimal glands: Secondary | ICD-10-CM | POA: Diagnosis not present

## 2016-02-11 DIAGNOSIS — I10 Essential (primary) hypertension: Secondary | ICD-10-CM | POA: Diagnosis not present

## 2016-02-11 DIAGNOSIS — R55 Syncope and collapse: Secondary | ICD-10-CM | POA: Diagnosis not present

## 2016-02-11 DIAGNOSIS — F332 Major depressive disorder, recurrent severe without psychotic features: Secondary | ICD-10-CM | POA: Diagnosis not present

## 2016-02-11 DIAGNOSIS — R799 Abnormal finding of blood chemistry, unspecified: Secondary | ICD-10-CM | POA: Diagnosis not present

## 2016-02-11 DIAGNOSIS — R0789 Other chest pain: Secondary | ICD-10-CM | POA: Diagnosis not present

## 2016-02-11 DIAGNOSIS — R159 Full incontinence of feces: Secondary | ICD-10-CM | POA: Diagnosis not present

## 2016-02-11 DIAGNOSIS — Z79899 Other long term (current) drug therapy: Secondary | ICD-10-CM | POA: Diagnosis not present

## 2016-02-12 DIAGNOSIS — F332 Major depressive disorder, recurrent severe without psychotic features: Secondary | ICD-10-CM | POA: Diagnosis not present

## 2016-02-12 DIAGNOSIS — R0789 Other chest pain: Secondary | ICD-10-CM | POA: Diagnosis not present

## 2016-02-12 DIAGNOSIS — R159 Full incontinence of feces: Secondary | ICD-10-CM | POA: Diagnosis not present

## 2016-02-12 DIAGNOSIS — R55 Syncope and collapse: Secondary | ICD-10-CM | POA: Diagnosis not present

## 2016-02-12 DIAGNOSIS — I10 Essential (primary) hypertension: Secondary | ICD-10-CM | POA: Diagnosis not present

## 2016-02-12 DIAGNOSIS — Z79899 Other long term (current) drug therapy: Secondary | ICD-10-CM | POA: Diagnosis not present

## 2016-02-12 DIAGNOSIS — R799 Abnormal finding of blood chemistry, unspecified: Secondary | ICD-10-CM | POA: Diagnosis not present

## 2016-02-20 DIAGNOSIS — H04123 Dry eye syndrome of bilateral lacrimal glands: Secondary | ICD-10-CM | POA: Diagnosis not present

## 2016-02-20 DIAGNOSIS — Z48810 Encounter for surgical aftercare following surgery on the sense organs: Secondary | ICD-10-CM | POA: Diagnosis not present

## 2016-02-20 DIAGNOSIS — Z79899 Other long term (current) drug therapy: Secondary | ICD-10-CM | POA: Diagnosis not present

## 2016-02-20 DIAGNOSIS — G245 Blepharospasm: Secondary | ICD-10-CM | POA: Diagnosis not present

## 2016-02-20 DIAGNOSIS — H1013 Acute atopic conjunctivitis, bilateral: Secondary | ICD-10-CM | POA: Diagnosis not present

## 2016-02-20 DIAGNOSIS — Z9841 Cataract extraction status, right eye: Secondary | ICD-10-CM | POA: Diagnosis not present

## 2016-02-20 DIAGNOSIS — I10 Essential (primary) hypertension: Secondary | ICD-10-CM | POA: Diagnosis not present

## 2016-02-20 DIAGNOSIS — H5213 Myopia, bilateral: Secondary | ICD-10-CM | POA: Diagnosis not present

## 2016-02-20 DIAGNOSIS — H524 Presbyopia: Secondary | ICD-10-CM | POA: Diagnosis not present

## 2016-02-20 DIAGNOSIS — H01025 Squamous blepharitis left lower eyelid: Secondary | ICD-10-CM | POA: Diagnosis not present

## 2016-02-20 DIAGNOSIS — H01022 Squamous blepharitis right lower eyelid: Secondary | ICD-10-CM | POA: Diagnosis not present

## 2016-02-20 DIAGNOSIS — Z961 Presence of intraocular lens: Secondary | ICD-10-CM | POA: Diagnosis not present

## 2016-02-20 DIAGNOSIS — H01021 Squamous blepharitis right upper eyelid: Secondary | ICD-10-CM | POA: Diagnosis not present

## 2016-02-20 DIAGNOSIS — Z9842 Cataract extraction status, left eye: Secondary | ICD-10-CM | POA: Diagnosis not present

## 2016-02-20 DIAGNOSIS — H52202 Unspecified astigmatism, left eye: Secondary | ICD-10-CM | POA: Diagnosis not present

## 2016-02-20 DIAGNOSIS — H01024 Squamous blepharitis left upper eyelid: Secondary | ICD-10-CM | POA: Diagnosis not present

## 2016-02-26 ENCOUNTER — Ambulatory Visit: Payer: Medicare Other | Admitting: Family Medicine

## 2016-03-05 ENCOUNTER — Encounter: Payer: Self-pay | Admitting: Family Medicine

## 2016-03-05 ENCOUNTER — Ambulatory Visit (INDEPENDENT_AMBULATORY_CARE_PROVIDER_SITE_OTHER): Payer: Medicare Other | Admitting: Family Medicine

## 2016-03-05 VITALS — BP 155/88 | HR 80 | Temp 97.7°F | Ht 67.0 in | Wt 157.8 lb

## 2016-03-05 DIAGNOSIS — F411 Generalized anxiety disorder: Secondary | ICD-10-CM | POA: Diagnosis not present

## 2016-03-05 DIAGNOSIS — I10 Essential (primary) hypertension: Secondary | ICD-10-CM

## 2016-03-05 MED ORDER — AMITRIPTYLINE HCL 50 MG PO TABS: 50.0000 mg | ORAL_TABLET | Freq: Every day | ORAL | 2 refills | Status: DC

## 2016-03-05 NOTE — Progress Notes (Signed)
BP (!) 150/90   Pulse 75   Temp 97.7 F (36.5 C) (Oral)   Ht 5\' 7"  (1.702 m)   Wt 157 lb 12.8 oz (71.6 kg)   BMI 24.71 kg/m    Subjective:    Patient ID: Vanessa Pitts, female    DOB: 09/02/1945, 71 y.o.   MRN: [id number]  HPI: Vanessa Pitts is a 71 y.o. female presenting on 03/05/2016 for Follow-up (hospitalized [facility], [facility] - 1/3, dehydration / virus, BP had been low)   HPI Hypertension Patient comes in today for hypertension recheck. Her blood pressure is 150/90. She has been taking lisinopril-hydrochlorothiazide combo. She did have an incident where she had a viral illness and got dehydrated earlier this month about 3 weeks ago and stopped the blood pressure pill for little bit but has been taking it for at least a week now. He denies any chest pain, shortness of breath, headaches or vision issues, abdominal complaints, diarrhea, nausea, vomiting, or joint issues.   Anxiety disorder Patient is coming in for recheck on her anxiety disorder. She says she tried the Wellbutrin and it made her feel funny and she cannot continue to take it so she has gone back to just taking the amitriptyline like she was previously. She says the amitriptyline is helping her well and is helping with her neuropathy and she is sleeping at night. She is pretty happy with life and content and wants to keep just the amitriptyline at 50 mg for now.  Relevant past medical, surgical, family and social history reviewed and updated as indicated. Interim medical history since our last visit reviewed. Allergies and medications reviewed and updated.  Review of Systems  Constitutional: Negative for chills and fever.  Eyes: Negative for visual disturbance.  Respiratory: Negative for chest tightness and shortness of breath.   Cardiovascular: Negative for chest pain and leg swelling.  Genitourinary: Negative for difficulty urinating and dysuria.  Musculoskeletal: Negative for back pain and gait problem.    Skin: Negative for rash.  Neurological: Negative for dizziness, weakness, light-headedness, numbness and headaches.  Psychiatric/Behavioral: Negative for agitation and behavioral problems.  All other systems reviewed and are negative.   Per HPI unless specifically indicated above     Objective:    BP (!) 150/90   Pulse 75   Temp 97.7 F (36.5 C) (Oral)   Ht 5\' 7"  (1.702 m)   Wt 157 lb 12.8 oz (71.6 kg)   BMI 24.71 kg/m   Wt Readings from Last 3 Encounters:  03/05/16 157 lb 12.8 oz (71.6 kg)  12/24/15 156 lb 6 oz (70.9 kg)  10/24/15 159 lb 2 oz (72.2 kg)    Physical Exam  Constitutional: She is oriented to person, place, and time. She appears well-developed and well-nourished. No distress.  Eyes: Conjunctivae are normal.  Cardiovascular: Normal rate, regular rhythm, normal heart sounds and intact distal pulses.   No murmur heard. Pulmonary/Chest: Effort normal and breath sounds normal. No respiratory distress. She has no wheezes. She has no rales.  Musculoskeletal: Normal range of motion. She exhibits no edema or tenderness.  Neurological: She is alert and oriented to person, place, and time. Coordination normal.  Skin: Skin is warm and dry. No rash noted. She is not diaphoretic.  Psychiatric: She has a normal mood and affect. Her behavior is normal.  Nursing note and vitals reviewed.     Assessment & Plan:   Problem List Items Addressed This Visit  Cardiovascular and Mediastinum   HTN (hypertension) - Primary     Other   Generalized anxiety disorder   Relevant Medications   amitriptyline (ELAVIL) 50 MG tablet       Follow up plan: Return if symptoms worsen or fail to improve.  Counseling provided for all of the vaccine components No orders of the defined types were placed in this encounter.   Arville Care, MD [facility] 03/05/2016, 2:11 PM

## 2016-03-16 DIAGNOSIS — Z9841 Cataract extraction status, right eye: Secondary | ICD-10-CM | POA: Diagnosis not present

## 2016-03-16 DIAGNOSIS — Z9842 Cataract extraction status, left eye: Secondary | ICD-10-CM | POA: Diagnosis not present

## 2016-03-16 DIAGNOSIS — I10 Essential (primary) hypertension: Secondary | ICD-10-CM | POA: Diagnosis not present

## 2016-03-16 DIAGNOSIS — G245 Blepharospasm: Secondary | ICD-10-CM | POA: Diagnosis not present

## 2016-03-16 DIAGNOSIS — Z961 Presence of intraocular lens: Secondary | ICD-10-CM | POA: Diagnosis not present

## 2016-03-26 DIAGNOSIS — H01022 Squamous blepharitis right lower eyelid: Secondary | ICD-10-CM | POA: Diagnosis not present

## 2016-03-26 DIAGNOSIS — H01021 Squamous blepharitis right upper eyelid: Secondary | ICD-10-CM | POA: Diagnosis not present

## 2016-03-26 DIAGNOSIS — H524 Presbyopia: Secondary | ICD-10-CM | POA: Diagnosis not present

## 2016-03-26 DIAGNOSIS — Z9841 Cataract extraction status, right eye: Secondary | ICD-10-CM | POA: Diagnosis not present

## 2016-03-26 DIAGNOSIS — H1010 Acute atopic conjunctivitis, unspecified eye: Secondary | ICD-10-CM | POA: Diagnosis not present

## 2016-03-26 DIAGNOSIS — H01024 Squamous blepharitis left upper eyelid: Secondary | ICD-10-CM | POA: Diagnosis not present

## 2016-03-26 DIAGNOSIS — H04123 Dry eye syndrome of bilateral lacrimal glands: Secondary | ICD-10-CM | POA: Diagnosis not present

## 2016-03-26 DIAGNOSIS — Z48810 Encounter for surgical aftercare following surgery on the sense organs: Secondary | ICD-10-CM | POA: Diagnosis not present

## 2016-03-26 DIAGNOSIS — H01025 Squamous blepharitis left lower eyelid: Secondary | ICD-10-CM | POA: Diagnosis not present

## 2016-03-26 DIAGNOSIS — Z961 Presence of intraocular lens: Secondary | ICD-10-CM | POA: Diagnosis not present

## 2016-03-26 DIAGNOSIS — G245 Blepharospasm: Secondary | ICD-10-CM | POA: Diagnosis not present

## 2016-04-21 DIAGNOSIS — M79672 Pain in left foot: Secondary | ICD-10-CM | POA: Diagnosis not present

## 2016-04-21 DIAGNOSIS — M25572 Pain in left ankle and joints of left foot: Secondary | ICD-10-CM | POA: Diagnosis not present

## 2016-05-08 ENCOUNTER — Other Ambulatory Visit: Payer: Self-pay | Admitting: Family Medicine

## 2016-05-08 DIAGNOSIS — I10 Essential (primary) hypertension: Secondary | ICD-10-CM

## 2016-06-04 ENCOUNTER — Ambulatory Visit: Payer: Medicare Other | Admitting: Family Medicine

## 2016-06-11 DIAGNOSIS — G245 Blepharospasm: Secondary | ICD-10-CM | POA: Diagnosis not present

## 2016-06-22 DIAGNOSIS — M48061 Spinal stenosis, lumbar region without neurogenic claudication: Secondary | ICD-10-CM | POA: Diagnosis not present

## 2016-06-30 DIAGNOSIS — M4726 Other spondylosis with radiculopathy, lumbar region: Secondary | ICD-10-CM | POA: Diagnosis not present

## 2016-06-30 DIAGNOSIS — M4807 Spinal stenosis, lumbosacral region: Secondary | ICD-10-CM | POA: Diagnosis not present

## 2016-06-30 DIAGNOSIS — M5117 Intervertebral disc disorders with radiculopathy, lumbosacral region: Secondary | ICD-10-CM | POA: Diagnosis not present

## 2016-06-30 DIAGNOSIS — M5416 Radiculopathy, lumbar region: Secondary | ICD-10-CM | POA: Diagnosis not present

## 2016-07-03 ENCOUNTER — Telehealth: Payer: Self-pay

## 2016-07-03 DIAGNOSIS — I10 Essential (primary) hypertension: Secondary | ICD-10-CM

## 2016-07-03 MED ORDER — LISINOPRIL-HYDROCHLOROTHIAZIDE 20-25 MG PO TABS: 1.0000 | ORAL_TABLET | Freq: Every day | ORAL | 2 refills | Status: DC

## 2016-07-03 NOTE — Telephone Encounter (Signed)
error 

## 2016-07-22 ENCOUNTER — Ambulatory Visit: Payer: Medicare Other | Admitting: Family Medicine

## 2016-07-22 DIAGNOSIS — M48061 Spinal stenosis, lumbar region without neurogenic claudication: Secondary | ICD-10-CM | POA: Diagnosis not present

## 2016-07-22 DIAGNOSIS — M4716 Other spondylosis with myelopathy, lumbar region: Secondary | ICD-10-CM | POA: Diagnosis not present

## 2016-07-22 DIAGNOSIS — M4316 Spondylolisthesis, lumbar region: Secondary | ICD-10-CM | POA: Diagnosis not present

## 2016-07-28 DIAGNOSIS — M4716 Other spondylosis with myelopathy, lumbar region: Secondary | ICD-10-CM | POA: Diagnosis not present

## 2016-07-28 DIAGNOSIS — M5126 Other intervertebral disc displacement, lumbar region: Secondary | ICD-10-CM | POA: Diagnosis not present

## 2016-07-28 DIAGNOSIS — M47816 Spondylosis without myelopathy or radiculopathy, lumbar region: Secondary | ICD-10-CM | POA: Diagnosis not present

## 2016-07-28 DIAGNOSIS — M48061 Spinal stenosis, lumbar region without neurogenic claudication: Secondary | ICD-10-CM | POA: Diagnosis not present

## 2016-07-28 DIAGNOSIS — M4807 Spinal stenosis, lumbosacral region: Secondary | ICD-10-CM | POA: Diagnosis not present

## 2016-07-28 DIAGNOSIS — M9983 Other biomechanical lesions of lumbar region: Secondary | ICD-10-CM | POA: Diagnosis not present

## 2016-08-20 ENCOUNTER — Other Ambulatory Visit (HOSPITAL_COMMUNITY): Payer: Self-pay | Admitting: Neurosurgery

## 2016-08-20 DIAGNOSIS — Z780 Asymptomatic menopausal state: Secondary | ICD-10-CM

## 2016-08-27 ENCOUNTER — Ambulatory Visit (HOSPITAL_COMMUNITY)
Admission: RE | Admit: 2016-08-27 | Discharge: 2016-08-27 | Disposition: A | Discharge status: Home / Self Care | Payer: Medicare Other | Source: Ambulatory Visit | Attending: Neurosurgery | Admitting: Neurosurgery

## 2016-08-27 DIAGNOSIS — M810 Age-related osteoporosis without current pathological fracture: Secondary | ICD-10-CM | POA: Diagnosis not present

## 2016-08-27 DIAGNOSIS — Z780 Asymptomatic menopausal state: Secondary | ICD-10-CM | POA: Diagnosis not present

## 2016-09-09 ENCOUNTER — Other Ambulatory Visit: Payer: Self-pay | Admitting: *Deleted

## 2016-09-09 DIAGNOSIS — I10 Essential (primary) hypertension: Secondary | ICD-10-CM

## 2016-09-09 MED ORDER — LISINOPRIL-HYDROCHLOROTHIAZIDE 20-25 MG PO TABS: 1.0000 | ORAL_TABLET | Freq: Every day | ORAL | 0 refills | Status: DC

## 2016-09-10 MED ORDER — CYCLOBENZAPRINE HCL 10 MG PO TABS: 10.0000 mg | ORAL_TABLET | Freq: Three times a day (TID) | ORAL | 3 refills | Status: DC | PRN

## 2016-09-14 DIAGNOSIS — M48061 Spinal stenosis, lumbar region without neurogenic claudication: Secondary | ICD-10-CM | POA: Diagnosis not present

## 2016-09-14 DIAGNOSIS — M4316 Spondylolisthesis, lumbar region: Secondary | ICD-10-CM | POA: Diagnosis not present

## 2016-09-15 DIAGNOSIS — Z9889 Other specified postprocedural states: Secondary | ICD-10-CM | POA: Diagnosis not present

## 2016-09-15 DIAGNOSIS — Z79899 Other long term (current) drug therapy: Secondary | ICD-10-CM | POA: Diagnosis not present

## 2016-09-15 DIAGNOSIS — G245 Blepharospasm: Secondary | ICD-10-CM | POA: Diagnosis not present

## 2016-09-15 DIAGNOSIS — Z9842 Cataract extraction status, left eye: Secondary | ICD-10-CM | POA: Diagnosis not present

## 2016-09-15 DIAGNOSIS — Z9841 Cataract extraction status, right eye: Secondary | ICD-10-CM | POA: Diagnosis not present

## 2016-09-15 DIAGNOSIS — I10 Essential (primary) hypertension: Secondary | ICD-10-CM | POA: Diagnosis not present

## 2016-09-15 DIAGNOSIS — Z961 Presence of intraocular lens: Secondary | ICD-10-CM | POA: Diagnosis not present

## 2016-11-13 ENCOUNTER — Other Ambulatory Visit: Payer: Self-pay

## 2016-11-13 MED ORDER — IBUPROFEN 800 MG PO TABS: 800.0000 mg | ORAL_TABLET | Freq: Three times a day (TID) | ORAL | 5 refills | Status: DC | PRN

## 2016-11-13 NOTE — Telephone Encounter (Signed)
Patients last visit was January 2018. Please advise on refill

## 2016-12-15 DIAGNOSIS — M4800 Spinal stenosis, site unspecified: Secondary | ICD-10-CM | POA: Diagnosis not present

## 2016-12-23 DIAGNOSIS — G245 Blepharospasm: Secondary | ICD-10-CM | POA: Diagnosis not present

## 2016-12-23 DIAGNOSIS — Z79899 Other long term (current) drug therapy: Secondary | ICD-10-CM | POA: Diagnosis not present

## 2016-12-23 DIAGNOSIS — I10 Essential (primary) hypertension: Secondary | ICD-10-CM | POA: Diagnosis not present

## 2017-01-20 ENCOUNTER — Other Ambulatory Visit: Payer: Self-pay

## 2017-01-20 MED ORDER — PRAVASTATIN SODIUM 20 MG PO TABS: 20.0000 mg | ORAL_TABLET | Freq: Every day | ORAL | 1 refills | Status: DC

## 2017-02-03 ENCOUNTER — Other Ambulatory Visit: Payer: Self-pay | Admitting: Family Medicine

## 2017-02-03 DIAGNOSIS — I10 Essential (primary) hypertension: Secondary | ICD-10-CM

## 2017-02-03 NOTE — Telephone Encounter (Signed)
Last seen 03/05/16  Dr D

## 2017-02-03 NOTE — Telephone Encounter (Signed)
Patient aware and verbalizes understanding. 

## 2017-02-03 NOTE — Telephone Encounter (Signed)
Please have patient schedule appt w/ Dr D.  BP med sent in x1 month.

## 2017-02-18 ENCOUNTER — Encounter: Payer: Self-pay | Admitting: Family Medicine

## 2017-02-18 ENCOUNTER — Ambulatory Visit (INDEPENDENT_AMBULATORY_CARE_PROVIDER_SITE_OTHER): Payer: Medicare Other | Admitting: Family Medicine

## 2017-02-18 VITALS — BP 131/81 | HR 84 | Temp 97.7°F | Ht 67.0 in | Wt 162.0 lb

## 2017-02-18 DIAGNOSIS — J309 Allergic rhinitis, unspecified: Secondary | ICD-10-CM | POA: Diagnosis not present

## 2017-02-18 MED ORDER — FLUTICASONE PROPIONATE 50 MCG/ACT NA SUSP: 1.0000 | Freq: Two times a day (BID) | NASAL | 6 refills | Status: DC | PRN

## 2017-02-18 MED ORDER — LOSARTAN POTASSIUM-HCTZ 100-25 MG PO TABS: 1.0000 | ORAL_TABLET | Freq: Every day | ORAL | 2 refills | Status: DC

## 2017-02-18 MED ORDER — CETIRIZINE HCL 10 MG PO TABS: 10.0000 mg | ORAL_TABLET | Freq: Every day | ORAL | 11 refills | Status: DC

## 2017-02-18 NOTE — Progress Notes (Signed)
Subjective:  HPI: 72 y/o Caucasian patient presents to the clinic with a sore throat x 2 weeks with facial pressure, runny nose, and headache. She has felt drainage in the back of her throat, especially at night and in the mornings that has caused her throat to feel "raw". She is also having mild discomfort when swallowing, but denies trouble swallowing food or water. She has tried 2-3 OTC throat therapies with minimum relief, but cannot recall the names of the medications.  Patient states she has also had a dry cough for about 4-5 months that she attributes to her Lisinopril combination hypertension medication.   ROS: General: (-) fatigue, fever/chills, weakness, malaise HEENT:  Head: (+)HA  Ears: (-) earache, drainage/discharge  Eyes: (+) dry eyes (-) pain, erythema, diplopia  Nose: (+) postnasal drip, runny nose, sinus pressure (-) stuffiness, discharge, itching  Throat (+) sore throat, odynophagia (-) dry mouth, hoarseness, dysphagia Neck: (-) lumps, swollen glands, pain, stiffness Pulmonary: (+) cough: dry cough for 4-5 months (-) dyspnea (at rest, or on exertion), hemoptysis, wheezing, painful respirations CV: (-) chest pain/discomfort, tightness, palpitations, dyspnea (at rest, or on exertion), difficulty breathing  Objective:  Vitals: BP: 131/81 mmHg, elevated; HR: 84 bpm, regular; Temperature: 97.7 F, regular; Weight: 162lb; Height: 5'7"  PE:  General: 72 y/o female, appears stated age and well-nourished, no gross abnormalities, no signs of acute distress. AAO X3. HEENT:  Eyes: Mild conjunctival injection. No scleral icterus or discharge. No abnormal  pigmentation, hemorrhages, or exudates.  Ears: Patient had hearing aids in both ears. External ears free of pain, lesions, and deformities with a clear canal. TM is pearly gray, translucent  with visible landmarks. Right ear was slightly impacted with cerumen, but was cleared out.  Nose: no external lesions/inflammation. Mucosa  non-inflamed with normal midline septum  and turbinates.  Throat and Mouth: no visible mucosal lesions, bleeding, infection. Mucous membrane pink and moist. Throat was mildly pink. Neck: Trachea midline and mobile. Non-palpable, normal consistency and sized thyroid without lesions. No palpable or tender lymph nodes. Pulmonary: Clear to auscultation bilaterally, no wheezes, rales, or rhonchi noted.  Cardiovascular: RRR, normal S1 and S2, no murmurs, rubs, or gallops.  Assessment:  Allergic rhinitis: Patient presented with classic signs of allergic rhinitis such as sore throat, facial pressure, runny nose, and post-nasal drip. Physical exam was mostly insignificant except for allergic shiners and mild erythema in throat. Patient is being switched from Lisinopril to Losartan to see if the medication was the cause of her chronic cough.  Plan: Patient is being prescribed Flonase and Zyrtec for her allergic rhinitis. She has been instructed to call the office if her symptoms worsen or if she has problems breathing. Patient was educate on ADRs of medication.  Patient seen and examined with Caprice Renshawaroline cheek PA student.  I agree with assessment and plan above. Arville CareJoshua Dettinger, MD [facility] 02/18/2017, 4:56 PM

## 2017-03-16 DIAGNOSIS — M48062 Spinal stenosis, lumbar region with neurogenic claudication: Secondary | ICD-10-CM | POA: Insufficient documentation

## 2017-03-16 DIAGNOSIS — M4316 Spondylolisthesis, lumbar region: Secondary | ICD-10-CM | POA: Diagnosis not present

## 2017-03-18 ENCOUNTER — Other Ambulatory Visit: Payer: Self-pay | Admitting: Nurse Practitioner

## 2017-03-18 DIAGNOSIS — M48062 Spinal stenosis, lumbar region with neurogenic claudication: Secondary | ICD-10-CM

## 2017-03-23 ENCOUNTER — Telehealth: Payer: Self-pay | Admitting: Family Medicine

## 2017-03-23 ENCOUNTER — Other Ambulatory Visit: Payer: Self-pay | Admitting: Family Medicine

## 2017-03-23 MED ORDER — LOSARTAN POTASSIUM 100 MG PO TABS: 100.0000 mg | ORAL_TABLET | Freq: Every day | ORAL | 0 refills | Status: DC

## 2017-03-23 MED ORDER — HYDROCHLOROTHIAZIDE 25 MG PO TABS: 25.0000 mg | ORAL_TABLET | Freq: Every day | ORAL | 0 refills | Status: DC

## 2017-03-23 NOTE — Telephone Encounter (Signed)
Covering PCP- please advise and send back to the pools. 

## 2017-03-23 NOTE — Telephone Encounter (Signed)
Attempted to contact patient - NA °

## 2017-03-23 NOTE — Telephone Encounter (Signed)
Combo pill recalled I think.  Can separate to Losartan and HCTZ independently.  This has been called in.  Please let me know if there is an issue with getting them separately.

## 2017-03-26 DIAGNOSIS — G245 Blepharospasm: Secondary | ICD-10-CM | POA: Diagnosis not present

## 2017-04-05 ENCOUNTER — Ambulatory Visit
Admission: RE | Admit: 2017-04-05 | Discharge: 2017-04-05 | Disposition: A | Discharge status: Home / Self Care | Payer: Medicare Other | Source: Ambulatory Visit | Attending: Nurse Practitioner | Admitting: Nurse Practitioner

## 2017-04-05 DIAGNOSIS — M48062 Spinal stenosis, lumbar region with neurogenic claudication: Secondary | ICD-10-CM

## 2017-04-05 DIAGNOSIS — M48061 Spinal stenosis, lumbar region without neurogenic claudication: Secondary | ICD-10-CM | POA: Diagnosis not present

## 2017-04-05 MED ORDER — IOPAMIDOL (ISOVUE-M 200) INJECTION 41%
1.0000 mL | Freq: Once | INTRAMUSCULAR | Status: AC
  Administered 2017-04-05: 1 mL via EPIDURAL

## 2017-04-05 MED ORDER — METHYLPREDNISOLONE ACETATE 40 MG/ML INJ SUSP (RADIOLOG
120.0000 mg | Freq: Once | INTRAMUSCULAR | Status: AC
  Administered 2017-04-05: 120 mg via EPIDURAL

## 2017-04-05 NOTE — Discharge Instructions (Signed)

## 2017-04-12 DIAGNOSIS — M48062 Spinal stenosis, lumbar region with neurogenic claudication: Secondary | ICD-10-CM | POA: Diagnosis not present

## 2017-04-12 DIAGNOSIS — M4316 Spondylolisthesis, lumbar region: Secondary | ICD-10-CM | POA: Diagnosis not present

## 2017-04-13 ENCOUNTER — Other Ambulatory Visit: Payer: Self-pay | Admitting: Nurse Practitioner

## 2017-04-14 ENCOUNTER — Other Ambulatory Visit: Payer: Self-pay | Admitting: Nurse Practitioner

## 2017-04-14 DIAGNOSIS — M48062 Spinal stenosis, lumbar region with neurogenic claudication: Secondary | ICD-10-CM

## 2017-04-27 ENCOUNTER — Telehealth: Payer: Self-pay

## 2017-04-27 NOTE — Telephone Encounter (Signed)
Chronic lumbar pain

## 2017-04-27 NOTE — Telephone Encounter (Signed)
What is patients DX for Cyclobenzaprine?  Getting a prior auth

## 2017-04-29 ENCOUNTER — Inpatient Hospital Stay
Admission: RE | Admit: 2017-04-29 | Discharge: 2017-04-29 | Disposition: A | Discharge status: Home / Self Care | Payer: Medicare Other | Source: Ambulatory Visit | Attending: Nurse Practitioner | Admitting: Nurse Practitioner

## 2017-04-30 ENCOUNTER — Telehealth: Payer: Self-pay

## 2017-04-30 NOTE — Telephone Encounter (Signed)
Insurance denied prior auth for IntelCyclobenzapine

## 2017-04-30 NOTE — Telephone Encounter (Signed)
Tell the patient to go to [email or web address] and type in cylobenzaprine and print out coupon, she can get it for 6 dollars without using insurance. Arville CareJoshua Abrahan Fulmore, MD [facility] 04/30/2017, 1:11 PM

## 2017-04-30 NOTE — Telephone Encounter (Signed)
No answer, no voicemail.

## 2017-05-05 NOTE — Telephone Encounter (Signed)
Attempts have been made to contact patient - no call back. This encounter will be closed.  

## 2017-05-24 ENCOUNTER — Other Ambulatory Visit: Payer: Self-pay | Admitting: Family Medicine

## 2017-05-26 ENCOUNTER — Ambulatory Visit
Admission: RE | Admit: 2017-05-26 | Discharge: 2017-05-26 | Disposition: A | Discharge status: Home / Self Care | Payer: Medicare Other | Source: Ambulatory Visit | Attending: Nurse Practitioner | Admitting: Nurse Practitioner

## 2017-05-26 DIAGNOSIS — M48062 Spinal stenosis, lumbar region with neurogenic claudication: Secondary | ICD-10-CM

## 2017-05-26 DIAGNOSIS — M47817 Spondylosis without myelopathy or radiculopathy, lumbosacral region: Secondary | ICD-10-CM | POA: Diagnosis not present

## 2017-05-26 MED ORDER — METHYLPREDNISOLONE ACETATE 40 MG/ML INJ SUSP (RADIOLOG
120.0000 mg | Freq: Once | INTRAMUSCULAR | Status: AC
  Administered 2017-05-26: 120 mg via EPIDURAL

## 2017-05-26 MED ORDER — IOPAMIDOL (ISOVUE-M 200) INJECTION 41%
1.0000 mL | Freq: Once | INTRAMUSCULAR | Status: AC
  Administered 2017-05-26: 1 mL via EPIDURAL

## 2017-05-26 NOTE — Discharge Instructions (Signed)

## 2017-05-31 ENCOUNTER — Telehealth: Payer: Self-pay | Admitting: Family Medicine

## 2017-05-31 MED ORDER — OLMESARTAN MEDOXOMIL 40 MG PO TABS: 40.0000 mg | ORAL_TABLET | Freq: Every day | ORAL | 1 refills | Status: DC

## 2017-05-31 NOTE — Telephone Encounter (Signed)
Patient aware of recommendation and change in medication.

## 2017-05-31 NOTE — Telephone Encounter (Signed)
I sent to [facility]icar because losartan is on recall again

## 2017-07-16 DIAGNOSIS — G245 Blepharospasm: Secondary | ICD-10-CM | POA: Diagnosis not present

## 2017-07-16 DIAGNOSIS — Z961 Presence of intraocular lens: Secondary | ICD-10-CM | POA: Diagnosis not present

## 2017-07-23 NOTE — Progress Notes (Signed)
Subjective: CC: HTN PCP: Dettinger, Elige Radon, MD ZOX:WRUE Pounds is a 72 y.o. female presenting to clinic today for:  1. Hypertension Patient reports that since she switched from losartan/hydrochlorothiazide to the olmesartan, she has been having lower extremity edema.  She notes that she held the olmesartan for a couple of days and lower extremity edema resolved.  She was not aware that she was also supposed to pick up the hydrochlorothiazide component when her medication was switched.  She has not been taking that.  Denies headache, dizziness, visual changes, nausea, vomiting, chest pain, LE swelling, abdominal pain or shortness of breath.  2.  Lump on her hand Patient points to the left palmar middle finger and notes that she has a lump in her hand.  She notes that this is been there for a while.  It is painless.  Denies any locking or popping sensation of the middle finger.  Her daughter wanted her to have it checked out to make sure it was not a blood clot.  She notes that she nits frequently and uses her hands quite a bit.  No numbness or tingling.  No weakness of the hand.  ROS: Per HPI  No Known Allergies Past Medical History:  Diagnosis Date  . Chronically dry eyes   . Hyperlipidemia   . Hypertension   . Leg pain, left   . Osteoporosis   . Ovarian cyst     Current Outpatient Medications:  .  alendronate (FOSAMAX) 70 MG tablet, Take 70 mg by mouth once a week. Take with a full glass of water on an empty stomach., Disp: , Rfl:  .  cetirizine (ZYRTEC) 10 MG tablet, Take 1 tablet (10 mg total) by mouth daily., Disp: 30 tablet, Rfl: 11 .  cyclobenzaprine (FLEXERIL) 10 MG tablet, Take 1 tablet (10 mg total) by mouth 3 (three) times daily as needed for muscle spasms., Disp: 30 tablet, Rfl: 3 .  doxycycline (VIBRA-TABS) 100 MG tablet, Take 100 mg by mouth 2 (two) times daily., Disp: , Rfl:  .  fluticasone (FLONASE) 50 MCG/ACT nasal spray, Place 1 spray into both nostrils 2 (two)  times daily as needed for allergies or rhinitis., Disp: 16 g, Rfl: 6 .  hydrochlorothiazide (HYDRODIURIL) 25 MG tablet, Take 1 tablet (25 mg total) by mouth daily., Disp: 90 tablet, Rfl: 0 .  ibuprofen (ADVIL,MOTRIN) 800 MG tablet, Take 1 tablet (800 mg total) by mouth every 8 (eight) hours as needed., Disp: 30 tablet, Rfl: 5 .  olmesartan (BENICAR) 40 MG tablet, Take 1 tablet (40 mg total) by mouth daily., Disp: 90 tablet, Rfl: 1 .  pravastatin (PRAVACHOL) 20 MG tablet, Take 1 tablet (20 mg total) by mouth daily., Disp: 90 tablet, Rfl: 1 .  traMADol (ULTRAM) 50 MG tablet, Take 50 mg by mouth 4 (four) times daily as needed., Disp: , Rfl:  Social History   Socioeconomic History  . Marital status: Married    Spouse name: Not on file  . Number of children: Not on file  . Years of education: Not on file  . Highest education level: Not on file  Occupational History  . Not on file  Social Needs  . Financial resource strain: Not on file  . Food insecurity:    Worry: Not on file    Inability: Not on file  . Transportation needs:    Medical: Not on file    Non-medical: Not on file  Tobacco Use  . Smoking status: Never Smoker  .  Smokeless tobacco: Never Used  Substance and Sexual Activity  . Alcohol use: No  . Drug use: No  . Sexual activity: Not on file  Lifestyle  . Physical activity:    Days per week: Not on file    Minutes per session: Not on file  . Stress: Not on file  Relationships  . Social connections:    Talks on phone: Not on file    Gets together: Not on file    Attends religious service: Not on file    Active member of club or organization: Not on file    Attends meetings of clubs or organizations: Not on file    Relationship status: Not on file  . Intimate partner violence:    Fear of current or ex partner: Not on file    Emotionally abused: Not on file    Physically abused: Not on file    Forced sexual activity: Not on file  Other Topics Concern  . Not on file    Social History Narrative  . Not on file   No family history on file.  Objective: Office vital signs reviewed. BP 136/78   Pulse 75   Temp 97.6 F (36.4 C) (Oral)   Ht 5\' 7"  (1.702 m)   Wt 165 lb (74.8 kg)   BMI 25.84 kg/m   Physical Examination:  General: Awake, alert, well nourished, No acute distress HEENT: Normal    Eyes: PERRLA, extraocular membranes intact, sclera white    Nose: nasal turbinates moist, no nasal discharge    Throat: moist mucus membranes Cardio: regular rate and rhythm, S1S2 heard, no murmurs appreciated Pulm: clear to auscultation bilaterally, no wheezes, rhonchi or rales; normal work of breathing on room air Extremities: warm, well perfused, No edema, cyanosis or clubbing; +2 pulses bilaterally  Left hand: Palmar aspect of the left hand has a palpable knot within the third A1 pulley.  This is nontender, nonfluctuant.  No evidence of inflammation, erythema.  Patient has full active range of motion of the hand.  No palpable click.  No visible contractures. Neuro: Light touch sensation grossly intact  Assessment/ Plan: 72 y.o. female   1. Essential hypertension Blood pressures well controlled on olmesartan but patient with lower extremity edema as a result of medication use.  While this is atypical, we did discuss that she was actually supposed to be on both the olmesartan and the hydrochlorothiazide.  Okay to start hydrochlorothiazide only and hold off on olmesartan, as I do not want to precipitate hypotension in this patient.  If blood pressures rise above 150s over 90s, okay to add back olmesartan.  May need to add back at a lower dose versus doing half dose of hydrochlorothiazide.  Patient will contact the office if her blood pressures exceed goal on single therapy hydrochlorothiazide.  Plan for CMP and lipid panel at next visit.  Patient aware will come in fasting  2. Trigger middle finger of left hand Appears to be a trigger middle finger.  She is  actually asymptomatic except for the palpable nodule.  There is no evidence of contracture to suggest Dupuytren's contracture at this point.  Handout was provided.  Reassurance.  If it becomes symptomatic can consider corticosteroid injection by orthopedics.  Meds ordered this encounter  Medications  . hydrochlorothiazide (HYDRODIURIL) 25 MG tablet    Sig: Take 1 tablet (25 mg total) by mouth daily.    Dispense:  90 tablet    Refill:  0  Janora Norlander, DO [facility] [phone]

## 2017-07-26 ENCOUNTER — Ambulatory Visit (INDEPENDENT_AMBULATORY_CARE_PROVIDER_SITE_OTHER): Payer: Medicare Other | Admitting: Family Medicine

## 2017-07-26 ENCOUNTER — Encounter: Payer: Self-pay | Admitting: Family Medicine

## 2017-07-26 VITALS — BP 136/78 | HR 75 | Temp 97.6°F | Ht 67.0 in | Wt 165.0 lb

## 2017-07-26 DIAGNOSIS — M65332 Trigger finger, left middle finger: Secondary | ICD-10-CM | POA: Diagnosis not present

## 2017-07-26 DIAGNOSIS — I10 Essential (primary) hypertension: Secondary | ICD-10-CM | POA: Diagnosis not present

## 2017-07-26 MED ORDER — HYDROCHLOROTHIAZIDE 25 MG PO TABS: 25.0000 mg | ORAL_TABLET | Freq: Every day | ORAL | 0 refills | Status: DC

## 2017-07-26 NOTE — Patient Instructions (Addendum)
I have prescribed you hydrochlorothiazide.  This is the "HCTZ" portion of your old blood pressure medication.  You may hold off on the Benicar (olmesartan) for now and see how your blood pressure does just on the hydrochlorothiazide.  We discussed that your blood pressure goal is less than 150/90.  If you are seeing blood pressures greater than 150/90 I would like you to contact me for instructions.   Trigger Finger Trigger finger (stenosing tenosynovitis) is a condition that causes a finger to get stuck in a bent position. Each finger has a tough, cord-like tissue that connects muscle to bone (tendon), and each tendon is surrounded by a tunnel of tissue (tendon sheath). To move your finger, your tendon needs to slide freely through the sheath. Trigger finger happens when the tendon or the sheath thickens, making it difficult to move your finger. Trigger finger can affect any finger or a thumb. It may affect more than one finger. Mild cases may clear up with rest and medicine. Severe cases require more treatment. What are the causes? Trigger finger is caused by a thickened finger tendon or tendon sheath. The cause of this thickening is not known. What increases the risk? The following factors may make you more likely to develop this condition:  Doing activities that require a strong grip.  Having rheumatoid arthritis, gout, or diabetes.  Being 72-6 years old.  Being a woman.  What are the signs or symptoms? Symptoms of this condition include:  Pain when bending or straightening your finger.  Tenderness or swelling where your finger attaches to the palm of your hand.  A lump in the palm of your hand or on the inside of your finger.  Hearing a popping sound when you try to straighten your finger.  Feeling a popping, catching, or locking sensation when you try to straighten your finger.  Being unable to straighten your finger.  How is this diagnosed? This condition is diagnosed based  on your symptoms and a physical exam. How is this treated? This condition may be treated by:  Resting your finger and avoiding activities that make symptoms worse.  Wearing a finger splint to keep your finger in a slightly bent position.  Taking NSAIDs to relieve pain and swelling.  Injecting medicine (steroids) into the tendon sheath to reduce swelling and irritation. Injections may need to be repeated.  Having surgery to open the tendon sheath. This may be done if other treatments do not work and you cannot straighten your finger. You may need physical therapy after surgery.  Follow these instructions at home:  Use moist heat to help reduce pain and swelling as told by your health care provider.  Rest your finger and avoid activities that make pain worse. Return to normal activities as told by your health care provider.  If you have a splint, wear it as told by your health care provider.  Take over-the-counter and prescription medicines only as told by your health care provider.  Keep all follow-up visits as told by your health care provider. This is important. Contact a health care provider if:  Your symptoms are not improving with home care. Summary  Trigger finger (stenosing tenosynovitis) causes your finger to get stuck in a bent position, and it can make it difficult and painful to straighten your finger.  This condition develops when a finger tendon or tendon sheath thickens.  Treatment starts with resting, wearing a splint, and taking NSAIDs.  In severe cases, surgery to open the tendon  sheath may be needed. This information is not intended to replace advice given to you by your health care provider. Make sure you discuss any questions you have with your health care provider. Document Released: 11/16/2003 Document Revised: 01/07/2016 Document Reviewed: 01/07/2016 Elsevier Interactive Patient Education  2017 [employer]

## 2017-08-18 ENCOUNTER — Ambulatory Visit (INDEPENDENT_AMBULATORY_CARE_PROVIDER_SITE_OTHER): Payer: Medicare Other | Admitting: Family Medicine

## 2017-08-18 ENCOUNTER — Encounter: Payer: Self-pay | Admitting: Family Medicine

## 2017-08-18 VITALS — BP 136/88 | HR 80 | Temp 97.8°F | Ht 67.0 in | Wt 163.0 lb

## 2017-08-18 DIAGNOSIS — K2100 Gastro-esophageal reflux disease with esophagitis, without bleeding: Secondary | ICD-10-CM

## 2017-08-18 DIAGNOSIS — I10 Essential (primary) hypertension: Secondary | ICD-10-CM | POA: Diagnosis not present

## 2017-08-18 NOTE — Progress Notes (Signed)
Subjective: CC: HTN PCP: Janora Norlander, DO THY:HOOI Vanessa Pitts is a 72 y.o. female presenting to clinic today for:  1. Hypertension Patient reports Blood pressure at home: controlled; Meds: Compliant with HCTZ, Side effects: Improvement in lower extremity edema.  Patient reports that since she discontinued the ARB, Lotrimin edema has improved greatly.  She reports good tolerance of the hydrochlorothiazide.  She continues to have intermittent lower extremity swelling but this occurs mostly towards the end of the day.  Not currently elevating legs, using compression hose.  ROS: Denies headache, dizziness, visual changes, nausea, vomiting, chest pain, abdominal pain or shortness of breath.  2. GERD Patient has had intermittent episodes of acid reflux.  She notes that these episodes have been relieved by ranitidine.  She is only had to take single tablets per each episode.  Denies any hematochezia, melena, nausea, vomiting.  ROS: Per HPI  No Known Allergies Past Medical History:  Diagnosis Date  . Chronically dry eyes   . Hyperlipidemia   . Hypertension   . Leg pain, left   . Osteoporosis   . Ovarian cyst     Current Outpatient Medications:  .  alendronate (FOSAMAX) 70 MG tablet, Take 70 mg by mouth once a week. Take with a full glass of water on an empty stomach., Disp: , Rfl:  .  cetirizine (ZYRTEC) 10 MG tablet, Take 1 tablet (10 mg total) by mouth daily., Disp: 30 tablet, Rfl: 11 .  cyclobenzaprine (FLEXERIL) 10 MG tablet, Take 1 tablet (10 mg total) by mouth 3 (three) times daily as needed for muscle spasms., Disp: 30 tablet, Rfl: 3 .  doxycycline (VIBRA-TABS) 100 MG tablet, Take 100 mg by mouth 2 (two) times daily., Disp: , Rfl:  .  fluticasone (FLONASE) 50 MCG/ACT nasal spray, Place 1 spray into both nostrils 2 (two) times daily as needed for allergies or rhinitis., Disp: 16 g, Rfl: 6 .  hydrochlorothiazide (HYDRODIURIL) 25 MG tablet, Take 1 tablet (25 mg total) by mouth  daily., Disp: 90 tablet, Rfl: 0 .  ibuprofen (ADVIL,MOTRIN) 800 MG tablet, Take 1 tablet (800 mg total) by mouth every 8 (eight) hours as needed., Disp: 30 tablet, Rfl: 5 .  pravastatin (PRAVACHOL) 20 MG tablet, Take 1 tablet (20 mg total) by mouth daily., Disp: 90 tablet, Rfl: 1 .  traMADol (ULTRAM) 50 MG tablet, Take 50 mg by mouth 4 (four) times daily as needed., Disp: , Rfl:  Social History   Socioeconomic History  . Marital status: Married    Spouse name: Not on file  . Number of children: Not on file  . Years of education: Not on file  . Highest education level: Not on file  Occupational History  . Not on file  Social Needs  . Financial resource strain: Not on file  . Food insecurity:    Worry: Not on file    Inability: Not on file  . Transportation needs:    Medical: Not on file    Non-medical: Not on file  Tobacco Use  . Smoking status: Never Smoker  . Smokeless tobacco: Never Used  Substance and Sexual Activity  . Alcohol use: No  . Drug use: No  . Sexual activity: Not on file  Lifestyle  . Physical activity:    Days per week: Not on file    Minutes per session: Not on file  . Stress: Not on file  Relationships  . Social connections:    Talks on phone: Not on file  Gets together: Not on file    Attends religious service: Not on file    Active member of club or organization: Not on file    Attends meetings of clubs or organizations: Not on file    Relationship status: Not on file  . Intimate partner violence:    Fear of current or ex partner: Not on file    Emotionally abused: Not on file    Physically abused: Not on file    Forced sexual activity: Not on file  Other Topics Concern  . Not on file  Social History Narrative  . Not on file   No family history on file.  Objective: Office vital signs reviewed. BP 136/88   Pulse 80   Temp 97.8 F (36.6 C) (Oral)   Ht '5\' 7"'  (1.702 m)   Wt 163 lb (73.9 kg)   BMI 25.53 kg/m   Physical Examination:    General: Awake, alert, well nourished, No acute distress Cardio: regular rate and rhythm, S1S2 heard, no murmurs appreciated Pulm: clear to auscultation bilaterally, no wheezes, rhonchi or rales; normal work of breathing on room air Extremities: warm, well perfused, +1 edema to the ankle R>L. No cyanosis or clubbing; +2 pulses bilaterally  Assessment/ Plan: 72 y.o. female   1. Essential hypertension Blood pressure well controlled on current regimen of hydrochlorthiazide 25 mg daily.  Check CMP for electrolyte evaluation and renal check.  Follow-up in 6 months or sooner if needed. - CMP14+EGFR  2. Gastroesophageal reflux disease with esophagitis Okay to continue as needed Zantac use.  Will check renal function as above.  She had a slight elevation in her creatinine 2 years ago.  May need to consider PPI or Tums if renal function significantly abnormal.   Orders Placed This Encounter  Procedures  . [place], [place] [phone]

## 2017-08-19 LAB — CMP14+EGFR
A/G RATIO: 2 (ref 1.2–2.2)
ALT: 14 IU/L (ref 0–32)
AST: 19 IU/L (ref 0–40)
Albumin: 4.3 g/dL (ref 3.5–4.8)
Alkaline Phosphatase: 43 IU/L (ref 39–117)
BUN/Creatinine Ratio: 22 (ref 12–28)
BUN: 27 mg/dL (ref 8–27)
Bilirubin Total: 0.2 mg/dL (ref 0.0–1.2)
CALCIUM: 9.4 mg/dL (ref 8.7–10.3)
CO2: 25 mmol/L (ref 20–29)
Chloride: 96 mmol/L (ref 96–106)
Creatinine, Ser: 1.23 mg/dL — ABNORMAL HIGH (ref 0.57–1.00)
GFR calc Af Amer: 51 mL/min/{1.73_m2} — ABNORMAL LOW (ref >59)
GFR, EST NON AFRICAN AMERICAN: 44 mL/min/{1.73_m2} — AB (ref >59)
GLOBULIN, TOTAL: 2.2 g/dL (ref 1.5–4.5)
Glucose: 91 mg/dL (ref 65–99)
POTASSIUM: 4 mmol/L (ref 3.5–5.2)
SODIUM: 137 mmol/L (ref 134–144)
Total Protein: 6.5 g/dL (ref 6.0–8.5)

## 2017-08-20 ENCOUNTER — Other Ambulatory Visit: Payer: Self-pay | Admitting: Family Medicine

## 2017-08-20 DIAGNOSIS — R7989 Other specified abnormal findings of blood chemistry: Secondary | ICD-10-CM

## 2017-08-20 NOTE — Progress Notes (Signed)
bmp 

## 2017-08-23 ENCOUNTER — Other Ambulatory Visit: Payer: Medicare Other

## 2017-08-23 DIAGNOSIS — R7989 Other specified abnormal findings of blood chemistry: Secondary | ICD-10-CM

## 2017-08-23 LAB — BASIC METABOLIC PANEL
BUN/Creatinine Ratio: 18 (ref 12–28)
BUN: 24 mg/dL (ref 8–27)
CALCIUM: 9.5 mg/dL (ref 8.7–10.3)
CO2: 26 mmol/L (ref 20–29)
CREATININE: 1.34 mg/dL — AB (ref 0.57–1.00)
Chloride: 97 mmol/L (ref 96–106)
GFR calc Af Amer: 46 mL/min/{1.73_m2} — ABNORMAL LOW (ref >59)
GFR, EST NON AFRICAN AMERICAN: 40 mL/min/{1.73_m2} — AB (ref >59)
GLUCOSE: 92 mg/dL (ref 65–99)
Potassium: 4.1 mmol/L (ref 3.5–5.2)
Sodium: 138 mmol/L (ref 134–144)

## 2017-09-09 ENCOUNTER — Other Ambulatory Visit: Payer: Self-pay

## 2017-09-20 ENCOUNTER — Encounter: Payer: Self-pay | Admitting: Family Medicine

## 2017-09-20 ENCOUNTER — Ambulatory Visit (INDEPENDENT_AMBULATORY_CARE_PROVIDER_SITE_OTHER): Payer: Medicare Other | Admitting: Family Medicine

## 2017-09-20 VITALS — BP 145/86 | HR 75 | Temp 97.0°F | Ht 67.0 in | Wt 162.0 lb

## 2017-09-20 DIAGNOSIS — I10 Essential (primary) hypertension: Secondary | ICD-10-CM

## 2017-09-20 DIAGNOSIS — R7989 Other specified abnormal findings of blood chemistry: Secondary | ICD-10-CM | POA: Diagnosis not present

## 2017-09-20 MED ORDER — TELMISARTAN 20 MG PO TABS: 20.0000 mg | ORAL_TABLET | Freq: Every day | ORAL | 1 refills | Status: DC

## 2017-09-20 MED ORDER — PRAVASTATIN SODIUM 20 MG PO TABS: 20.0000 mg | ORAL_TABLET | Freq: Every day | ORAL | 1 refills | Status: DC

## 2017-09-20 NOTE — Patient Instructions (Addendum)
Come in for lab check in 1 week.  I have sent in the Micardis to help with blood pressure and protect your kidneys.  Avoid taking medications like ibuprofen, Aleve and Excedrin if possible.  These can worsen your kidney function.   Chronic Kidney Disease, Adult Chronic kidney disease (CKD) happens when the kidneys are damaged during a time of 3 or more months. The kidneys are two organs that do many important jobs in the body. These jobs include:  Removing wastes and extra fluids from the blood.  Making hormones that maintain the amount of fluid in your tissues and blood vessels.  Making sure that the body has the right amount of fluids and chemicals.  Most of the time, this condition does not go away, but it can usually be controlled. Steps must be taken to slow down the kidney damage or stop it from getting worse. Otherwise, the kidneys may stop working. Follow these instructions at home:  Follow your diet as told by your doctor. You may need to avoid alcohol, salty foods (sodium), and foods that are high in potassium, calcium, and protein.  Take over-the-counter and prescription medicines only as told by your doctor. Do not take any new medicines unless your doctor says you can do that. These include vitamins and minerals. ? Medicines and nutritional supplements can make kidney damage worse. ? Your doctor may need to change how much medicine you take.  Do not use any tobacco products. These include cigarettes, chewing tobacco, and e-cigarettes. If you need help quitting, ask your doctor.  Keep all follow-up visits as told by your doctor. This is important.  Check your blood pressure. Tell your doctor if there are changes to your blood pressure.  Get to a healthy weight. Stay at that weight. If you need help with this, ask your doctor.  Start or continue an exercise plan. Try to exercise at least 30 minutes a day, 5 days a week.  Stay up-to-date with your shots (immunizations) as  told by your doctor. Contact a doctor if:  Your symptoms get worse.  You have new symptoms. Get help right away if:  You have symptoms of end-stage kidney disease. These include: ? Headaches. ? Skin that is darker or lighter than normal. ? Numbness in your hands or feet. ? Easy bruising. ? Having hiccups often. ? Chest pain. ? Shortness of breath. ? Stopping of menstrual periods in women.  You have a fever.  You are making very little pee (urine).  You have pain or bleeding when you pee (urinate). This information is not intended to replace advice given to you by your health care provider. Make sure you discuss any questions you have with your health care provider. Document Released: 04/22/2009 Document Revised: 07/04/2015 Document Reviewed: 09/25/2011 Elsevier Interactive Patient Education  2017 [employer]

## 2017-09-20 NOTE — Progress Notes (Signed)
Subjective: CC: Elevated serum creatinine PCP: [name], [name], DO ZOX:WRUEHPI:Vanessa Pitts is a 72 y.o. female presenting to clinic today for:  1. Elevated creatinine/hypertension Patient noted to have persistently elevated creatinine on last visit.  It seemed to have increased compared to previous.  Patient reports use of Excedrin daily.  She notes occasional use of ibuprofen for headaches.  She reports good hydration.  ARB was discontinued at last visit because she was having lower extremity swelling with Benicar blood pressure was well controlled on HCTZ only.  Urine output is normal.   ROS: Per HPI  No Known Allergies Past Medical History:  Diagnosis Date  . Chronically dry eyes   . Hyperlipidemia   . Hypertension   . Leg pain, left   . Osteoporosis   . Ovarian cyst     Current Outpatient Medications:  .  alendronate (FOSAMAX) 70 MG tablet, Take 70 mg by mouth once a week. Take with a full glass of water on an empty stomach., Disp: , Rfl:  .  cetirizine (ZYRTEC) 10 MG tablet, Take 1 tablet (10 mg total) by mouth daily., Disp: 30 tablet, Rfl: 11 .  cyclobenzaprine (FLEXERIL) 10 MG tablet, Take 1 tablet (10 mg total) by mouth 3 (three) times daily as needed for muscle spasms., Disp: 30 tablet, Rfl: 3 .  fluticasone (FLONASE) 50 MCG/ACT nasal spray, Place 1 spray into both nostrils 2 (two) times daily as needed for allergies or rhinitis., Disp: 16 g, Rfl: 6 .  hydrochlorothiazide (HYDRODIURIL) 25 MG tablet, Take 1 tablet (25 mg total) by mouth daily., Disp: 90 tablet, Rfl: 0 .  ibuprofen (ADVIL,MOTRIN) 800 MG tablet, Take 1 tablet (800 mg total) by mouth every 8 (eight) hours as needed., Disp: 30 tablet, Rfl: 5 .  pravastatin (PRAVACHOL) 20 MG tablet, Take 1 tablet (20 mg total) by mouth daily., Disp: 90 tablet, Rfl: 1 .  traMADol (ULTRAM) 50 MG tablet, Take 50 mg by mouth 4 (four) times daily as needed., Disp: , Rfl:  Social History   Socioeconomic History  . Marital status:  Married    Spouse name: Not on file  . Number of children: Not on file  . Years of education: Not on file  . Highest education level: Not on file  Occupational History  . Not on file  Social Needs  . Financial resource strain: Not on file  . Food insecurity:    Worry: Not on file    Inability: Not on file  . Transportation needs:    Medical: Not on file    Non-medical: Not on file  Tobacco Use  . Smoking status: Never Smoker  . Smokeless tobacco: Never Used  Substance and Sexual Activity  . Alcohol use: No  . Drug use: No  . Sexual activity: Not on file  Lifestyle  . Physical activity:    Days per week: Not on file    Minutes per session: Not on file  . Stress: Not on file  Relationships  . Social connections:    Talks on phone: Not on file    Gets together: Not on file    Attends religious service: Not on file    Active member of club or organization: Not on file    Attends meetings of clubs or organizations: Not on file    Relationship status: Not on file  . Intimate partner violence:    Fear of current or ex partner: Not on file    Emotionally abused: Not on  file    Physically abused: Not on file    Forced sexual activity: Not on file  Other Topics Concern  . Not on file  Social History Narrative  . Not on file   No family history on file.  Objective: Office vital signs reviewed. BP (!) 145/86   Pulse 75   Temp (!) 97 F (36.1 C) (Oral)   Ht 5\' 7"  (1.702 m)   Wt 162 lb (73.5 kg)   BMI 25.37 kg/m   Physical Examination:  General: Awake, alert, well nourished, No acute distress HEENT: sclera white, MMM Cardio: regular rate and rhythm, S1S2 heard, no murmurs appreciated Pulm: clear to auscultation bilaterally, no wheezes, rhonchi or rales; normal work of breathing on room air  Assessment/ Plan: 72 y.o. female   1. Elevated serum creatinine We discussed avoiding NSAIDs as much as possible.  Encourage p.o. hydration.  Start on my card is 20 mg daily.   We will plan to recheck creatinine in 1 week.  Home care instructions reviewed with the patient.  Follow-up in 4 weeks for recheck of blood pressure. - Basic Metabolic Panel; Future  2. Essential hypertension Blood pressure should be able to tolerate addition of ARB. - Basic Metabolic Panel; Future   Orders Placed This Encounter  Procedures  . Basic Metabolic Panel    Standing Status:   Future    Standing Expiration Date:   09/21/2018   Meds ordered this encounter  Medications  . telmisartan (MICARDIS) 20 MG tablet    Sig: Take 1 tablet (20 mg total) by mouth daily.    Dispense:  30 tablet    Refill:  1  . pravastatin (PRAVACHOL) 20 MG tablet    Sig: Take 1 tablet (20 mg total) by mouth daily.    Dispense:  90 tablet    Refill:  1     Ashly Hulen SkainsM Gottschalk, DO [facility] [phone]

## 2017-09-28 ENCOUNTER — Other Ambulatory Visit: Payer: Self-pay | Admitting: Nurse Practitioner

## 2017-09-28 DIAGNOSIS — M48062 Spinal stenosis, lumbar region with neurogenic claudication: Secondary | ICD-10-CM

## 2017-09-29 ENCOUNTER — Other Ambulatory Visit: Payer: Medicare Other

## 2017-09-29 DIAGNOSIS — R7989 Other specified abnormal findings of blood chemistry: Secondary | ICD-10-CM

## 2017-09-29 DIAGNOSIS — I10 Essential (primary) hypertension: Secondary | ICD-10-CM

## 2017-09-29 LAB — BASIC METABOLIC PANEL
BUN/Creatinine Ratio: 24 (ref 12–28)
BUN: 31 mg/dL — ABNORMAL HIGH (ref 8–27)
CALCIUM: 10 mg/dL (ref 8.7–10.3)
CO2: 24 mmol/L (ref 20–29)
CREATININE: 1.3 mg/dL — AB (ref 0.57–1.00)
Chloride: 95 mmol/L — ABNORMAL LOW (ref 96–106)
GFR calc Af Amer: 48 mL/min/{1.73_m2} — ABNORMAL LOW (ref >59)
GFR calc non Af Amer: 41 mL/min/{1.73_m2} — ABNORMAL LOW (ref >59)
GLUCOSE: 94 mg/dL (ref 65–99)
POTASSIUM: 4.1 mmol/L (ref 3.5–5.2)
SODIUM: 134 mmol/L (ref 134–144)

## 2017-10-18 ENCOUNTER — Ambulatory Visit (INDEPENDENT_AMBULATORY_CARE_PROVIDER_SITE_OTHER): Payer: Medicare Other | Admitting: Family Medicine

## 2017-10-18 ENCOUNTER — Encounter: Payer: Self-pay | Admitting: Family Medicine

## 2017-10-18 VITALS — BP 133/79 | HR 81 | Temp 97.9°F | Ht 67.0 in | Wt 162.0 lb

## 2017-10-18 DIAGNOSIS — I10 Essential (primary) hypertension: Secondary | ICD-10-CM

## 2017-10-18 DIAGNOSIS — N183 Chronic kidney disease, stage 3 unspecified: Secondary | ICD-10-CM | POA: Insufficient documentation

## 2017-10-18 DIAGNOSIS — K2100 Gastro-esophageal reflux disease with esophagitis, without bleeding: Secondary | ICD-10-CM

## 2017-10-18 MED ORDER — TELMISARTAN 20 MG PO TABS: 20.0000 mg | ORAL_TABLET | Freq: Every day | ORAL | 1 refills | Status: DC

## 2017-10-18 MED ORDER — PANTOPRAZOLE SODIUM 20 MG PO TBEC: 20.0000 mg | DELAYED_RELEASE_TABLET | Freq: Every day | ORAL | 1 refills | Status: DC

## 2017-10-18 NOTE — Progress Notes (Signed)
Subjective: CC: HTN PCP: Vanessa Ip, DO ZOX:WRUE Vanessa Pitts is a 72 y.o. female presenting to clinic today for:  1. Hypertension w/ CKD Patient reports she is Compliant with Micardis 20mg ; HCTZ 25mg , Side effects: None.  She notes that she is tolerating this ARB much better than she was the Benicar.  Denies headache, dizziness, visual changes, nausea, vomiting, chest pain, LE swelling, abdominal pain or shortness of breath.  2. GERD Patient reports more frequent acid reflux symptoms.  She describes bloating and some generalized lower abdominal pain.  She occasionally does have constipation.  Denies any nausea, vomiting, melena or hematochezia.  She has been using ranitidine which seems to last about 8 hours then wears off.  She is also tried Tums but notes that this does not last very long.  No history of gastric ulcers.    ROS: Per HPI  No Known Allergies Past Medical History:  Diagnosis Date  . Chronically dry eyes   . Hyperlipidemia   . Hypertension   . Leg pain, left   . Osteoporosis   . Ovarian cyst     Current Outpatient Medications:  .  alendronate (FOSAMAX) 70 MG tablet, Take 70 mg by mouth once a week. Take with a full glass of water on an empty stomach., Disp: , Rfl:  .  cetirizine (ZYRTEC) 10 MG tablet, Take 1 tablet (10 mg total) by mouth daily., Disp: 30 tablet, Rfl: 11 .  cyclobenzaprine (FLEXERIL) 10 MG tablet, Take 1 tablet (10 mg total) by mouth 3 (three) times daily as needed for muscle spasms., Disp: 30 tablet, Rfl: 3 .  fluticasone (FLONASE) 50 MCG/ACT nasal spray, Place 1 spray into both nostrils 2 (two) times daily as needed for allergies or rhinitis., Disp: 16 g, Rfl: 6 .  hydrochlorothiazide (HYDRODIURIL) 25 MG tablet, Take 1 tablet (25 mg total) by mouth daily., Disp: 90 tablet, Rfl: 0 .  pravastatin (PRAVACHOL) 20 MG tablet, Take 1 tablet (20 mg total) by mouth daily., Disp: 90 tablet, Rfl: 1 .  telmisartan (MICARDIS) 20 MG tablet, Take 1 tablet (20  mg total) by mouth daily., Disp: 30 tablet, Rfl: 1 .  traMADol (ULTRAM) 50 MG tablet, Take 50 mg by mouth 4 (four) times daily as needed., Disp: , Rfl:  Social History   Socioeconomic History  . Marital status: Married    Spouse name: Not on file  . Number of children: Not on file  . Years of education: Not on file  . Highest education level: Not on file  Occupational History  . Not on file  Social Needs  . Financial resource strain: Not on file  . Food insecurity:    Worry: Not on file    Inability: Not on file  . Transportation needs:    Medical: Not on file    Non-medical: Not on file  Tobacco Use  . Smoking status: Never Smoker  . Smokeless tobacco: Never Used  Substance and Sexual Activity  . Alcohol use: No  . Drug use: No  . Sexual activity: Not on file  Lifestyle  . Physical activity:    Days per week: Not on file    Minutes per session: Not on file  . Stress: Not on file  Relationships  . Social connections:    Talks on phone: Not on file    Gets together: Not on file    Attends religious service: Not on file    Active member of club or organization: Not on file  Attends meetings of clubs or organizations: Not on file    Relationship status: Not on file  . Intimate partner violence:    Fear of current or ex partner: Not on file    Emotionally abused: Not on file    Physically abused: Not on file    Forced sexual activity: Not on file  Other Topics Concern  . Not on file  Social History Narrative  . Not on file   No family history on file.  Objective: Office vital signs reviewed. BP 133/79   Pulse 81   Temp 97.9 F (36.6 C) (Oral)   Ht 5\' 7"  (1.702 m)   Wt 162 lb (73.5 kg)   BMI 25.37 kg/m   Physical Examination:  General: Awake, alert, well nourished, No acute distress HEENT: Normal, sclera white, MMM Cardio: regular rate and rhythm, S1S2 heard, no murmurs appreciated Pulm: clear to auscultation bilaterally, no wheezes, rhonchi or rales;  normal work of breathing on room air GI: soft, non-tender, non-distended, bowel sounds present x4, no hepatomegaly, no splenomegaly, no masses  Assessment/ Plan: 72 y.o. female   1. Essential hypertension Blood pressure well controlled on current regimen.  She is tolerating the Micardis without difficulty.  Her renal function was stable upon recheck.  Micardis refilled x6 months.  2. CKD (chronic kidney disease) stage 3, GFR 30-59 ml/min (HCC) Renal function stable.  ARB as above.  3. Gastroesophageal reflux disease with esophagitis Not controlled on ranitidine.  Given renal disease, will start PPI.  Protonix 20 mg daily prescribed.  We discussed the risks and benefits of PPI use, including increased risk of C. difficile and osteoporosis.  If persistent or develops any other concerning symptoms low threshold for referral to gastroenterology for further evaluation.  Meds ordered this encounter  Medications  . pantoprazole (PROTONIX) 20 MG tablet    Sig: Take 1 tablet (20 mg total) by mouth daily.    Dispense:  90 tablet    Refill:  1  . telmisartan (MICARDIS) 20 MG tablet    Sig: Take 1 tablet (20 mg total) by mouth daily.    Dispense:  90 tablet    Refill:  1    Vanessa Hulen Skains, DO [facility] [phone]

## 2017-10-22 ENCOUNTER — Ambulatory Visit
Admission: RE | Admit: 2017-10-22 | Discharge: 2017-10-22 | Disposition: A | Discharge status: Home / Self Care | Payer: Medicare Other | Source: Ambulatory Visit | Attending: Nurse Practitioner | Admitting: Nurse Practitioner

## 2017-10-22 DIAGNOSIS — M48062 Spinal stenosis, lumbar region with neurogenic claudication: Secondary | ICD-10-CM

## 2017-10-22 DIAGNOSIS — M545 Low back pain: Secondary | ICD-10-CM | POA: Diagnosis not present

## 2017-10-22 MED ORDER — IOPAMIDOL (ISOVUE-M 200) INJECTION 41%
1.0000 mL | Freq: Once | INTRAMUSCULAR | Status: AC
  Administered 2017-10-22: 1 mL via EPIDURAL

## 2017-10-22 MED ORDER — METHYLPREDNISOLONE ACETATE 40 MG/ML INJ SUSP (RADIOLOG
120.0000 mg | Freq: Once | INTRAMUSCULAR | Status: AC
  Administered 2017-10-22: 120 mg via EPIDURAL

## 2017-10-22 NOTE — Discharge Instructions (Signed)

## 2017-10-29 DIAGNOSIS — G245 Blepharospasm: Secondary | ICD-10-CM | POA: Diagnosis not present

## 2017-10-29 DIAGNOSIS — Z961 Presence of intraocular lens: Secondary | ICD-10-CM | POA: Diagnosis not present

## 2017-10-29 DIAGNOSIS — H04123 Dry eye syndrome of bilateral lacrimal glands: Secondary | ICD-10-CM | POA: Diagnosis not present

## 2017-11-22 ENCOUNTER — Ambulatory Visit (INDEPENDENT_AMBULATORY_CARE_PROVIDER_SITE_OTHER): Payer: Medicare Other

## 2017-11-22 DIAGNOSIS — Z23 Encounter for immunization: Secondary | ICD-10-CM

## 2017-12-20 ENCOUNTER — Other Ambulatory Visit: Payer: Self-pay | Admitting: Nurse Practitioner

## 2017-12-20 DIAGNOSIS — M48062 Spinal stenosis, lumbar region with neurogenic claudication: Secondary | ICD-10-CM

## 2018-01-10 ENCOUNTER — Other Ambulatory Visit: Payer: Medicare Other

## 2018-01-25 ENCOUNTER — Ambulatory Visit
Admission: RE | Admit: 2018-01-25 | Discharge: 2018-01-25 | Disposition: A | Discharge status: Home / Self Care | Payer: Medicare Other | Source: Ambulatory Visit | Attending: Nurse Practitioner | Admitting: Nurse Practitioner

## 2018-01-25 DIAGNOSIS — M48062 Spinal stenosis, lumbar region with neurogenic claudication: Secondary | ICD-10-CM

## 2018-01-25 DIAGNOSIS — M47817 Spondylosis without myelopathy or radiculopathy, lumbosacral region: Secondary | ICD-10-CM | POA: Diagnosis not present

## 2018-01-25 MED ORDER — IOPAMIDOL (ISOVUE-M 200) INJECTION 41%
1.0000 mL | Freq: Once | INTRAMUSCULAR | Status: AC
  Administered 2018-01-25: 1 mL via EPIDURAL

## 2018-01-25 MED ORDER — METHYLPREDNISOLONE ACETATE 40 MG/ML INJ SUSP (RADIOLOG
120.0000 mg | Freq: Once | INTRAMUSCULAR | Status: AC
  Administered 2018-01-25: 120 mg via EPIDURAL

## 2018-01-26 ENCOUNTER — Other Ambulatory Visit: Payer: Self-pay | Admitting: Family Medicine

## 2018-02-10 DIAGNOSIS — M4316 Spondylolisthesis, lumbar region: Secondary | ICD-10-CM | POA: Diagnosis not present

## 2018-02-10 DIAGNOSIS — M48062 Spinal stenosis, lumbar region with neurogenic claudication: Secondary | ICD-10-CM | POA: Diagnosis not present

## 2018-02-26 ENCOUNTER — Telehealth: Payer: Self-pay | Admitting: Family Medicine

## 2018-02-28 NOTE — Telephone Encounter (Signed)
Fax from [facility] Request to refill Doxycycline Original prescriber on fax is Edward Qualia in [facility]

## 2018-02-28 NOTE — Telephone Encounter (Signed)
Spoke with pt regarding RX Will contact eye Dr for [employer]

## 2018-02-28 NOTE — Telephone Encounter (Signed)
Can we verify why patient needs refill on doxy?  I see that it was originally prescribed by her ophthalmologist and do not see it on her active med list now.  She should ask for refills from prescribing doctor if it is related to eye care.

## 2018-03-21 ENCOUNTER — Ambulatory Visit (INDEPENDENT_AMBULATORY_CARE_PROVIDER_SITE_OTHER): Payer: Medicare Other | Admitting: Family Medicine

## 2018-03-21 ENCOUNTER — Encounter: Payer: Self-pay | Admitting: Family Medicine

## 2018-03-21 VITALS — BP 134/74 | HR 80 | Temp 97.9°F | Ht 67.0 in | Wt 159.0 lb

## 2018-03-21 DIAGNOSIS — N183 Chronic kidney disease, stage 3 unspecified: Secondary | ICD-10-CM

## 2018-03-21 DIAGNOSIS — L299 Pruritus, unspecified: Secondary | ICD-10-CM | POA: Diagnosis not present

## 2018-03-21 DIAGNOSIS — J309 Allergic rhinitis, unspecified: Secondary | ICD-10-CM | POA: Diagnosis not present

## 2018-03-21 DIAGNOSIS — M48062 Spinal stenosis, lumbar region with neurogenic claudication: Secondary | ICD-10-CM

## 2018-03-21 DIAGNOSIS — Z23 Encounter for immunization: Secondary | ICD-10-CM

## 2018-03-21 DIAGNOSIS — I10 Essential (primary) hypertension: Secondary | ICD-10-CM

## 2018-03-21 MED ORDER — HYDROCHLOROTHIAZIDE 25 MG PO TABS: 12.5000 mg | ORAL_TABLET | Freq: Every day | ORAL | 3 refills | Status: DC

## 2018-03-21 MED ORDER — TELMISARTAN 20 MG PO TABS: 20.0000 mg | ORAL_TABLET | Freq: Every day | ORAL | 1 refills | Status: DC

## 2018-03-21 MED ORDER — CETIRIZINE HCL 10 MG PO TABS: 5.0000 mg | ORAL_TABLET | Freq: Every day | ORAL | 3 refills | Status: DC

## 2018-03-21 MED ORDER — PRAVASTATIN SODIUM 20 MG PO TABS: 20.0000 mg | ORAL_TABLET | Freq: Every day | ORAL | 1 refills | Status: DC

## 2018-03-21 MED ORDER — PANTOPRAZOLE SODIUM 20 MG PO TBEC: 20.0000 mg | DELAYED_RELEASE_TABLET | Freq: Every day | ORAL | 1 refills | Status: DC

## 2018-03-21 MED ORDER — FLUTICASONE PROPIONATE 50 MCG/ACT NA SUSP
1.0000 | Freq: Two times a day (BID) | NASAL | 6 refills | Status: DC | PRN
Start: 2018-03-21 — End: 2021-11-25

## 2018-03-21 NOTE — Progress Notes (Signed)
Subjective: CC: HTN, CKD PCP: [name], DO QTM:AUQJ Vanessa Pitts is a 73 y.o. female presenting to clinic today for:  1. HTN/ CKD Patient reports compliance with Micardis 20 mg and hydrochlorothiazide 25 mg daily.  She does report some low blood pressures particularly in the morning prior to eating.  Sometimes these blood pressures can be less than 110 over 60s.  She feels dizzy and symptoms do not resolve until she is eaten.  Otherwise, blood pressures tend to be in the 130s over 70s.  No chest pain, shortness of breath, lower extremity edema.  She does report frequent urination during the night.  2.  Itchy ears/sinus pressure Patient reports ear itching and sinus pressure over the last week.  She has not been taking her Zyrtec or Flonase for a while now.  No purulent drainage from the nares.  No fevers.  She wears hearing aids.  No ear pain.  3.  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  She is currently on chronic caudal epidural injections every 3 months for this.  She notes that her specialist, Dr. Channing Mutters, is retiring in April and she is asking that I take over ordering her caudal epidural through [facility] imaging.  She does not want to see anybody else but Dr. Mosetta Putt and therefore has not tried to establish care at another orthopedist or neurosurgeons office.  She fears that they will force her to use 1 of their specialist rather than who she has been seeing.  She does report very good control of her symptoms and improvement in her ability to ambulate independently with these injections.  Her next scheduled injection is in March.   ROS: Per HPI  No Known Allergies Past Medical History:  Diagnosis Date  . Chronically dry eyes   . Hyperlipidemia   . Hypertension   . Leg pain, left   . Osteoporosis   . Ovarian cyst     Current Outpatient Medications:  .  alendronate (FOSAMAX) 70 MG tablet,  Take 70 mg by mouth once a week. Take with a full glass of water on an empty stomach., Disp: , Rfl:  .  cetirizine (ZYRTEC) 10 MG tablet, Take 1 tablet (10 mg total) by mouth daily., Disp: 30 tablet, Rfl: 11 .  fluticasone (FLONASE) 50 MCG/ACT nasal spray, Place 1 spray into both nostrils 2 (two) times daily as needed for allergies or rhinitis., Disp: 16 g, Rfl: 6 .  hydrochlorothiazide (HYDRODIURIL) 25 MG tablet, TAKE 1 TABLET BY MOUTH ONCE DAILY, Disp: 90 tablet, Rfl: 0 .  pantoprazole (PROTONIX) 20 MG tablet, Take 1 tablet (20 mg total) by mouth daily., Disp: 90 tablet, Rfl: 1 .  pravastatin (PRAVACHOL) 20 MG tablet, Take 1 tablet (20 mg total) by mouth daily., Disp: 90 tablet, Rfl: 1 .  telmisartan (MICARDIS) 20 MG tablet, Take 1 tablet (20 mg total) by mouth daily., Disp: 90 tablet, Rfl: 1 Social History   Socioeconomic History  . Marital status: Married    Spouse name: Not on file  . Number of children: Not on file  . Years of education: Not on file  . Highest education level: Not on file  Occupational History  . Not on file  Social Needs  . Financial resource strain: Not on file  . Food insecurity:    Worry: Not on file    Inability: Not on file  . Transportation needs:    Medical: Not  on file    Non-medical: Not on file  Tobacco Use  . Smoking status: Never Smoker  . Smokeless tobacco: Never Used  Substance and Sexual Activity  . Alcohol use: No  . Drug use: No  . Sexual activity: Not on file  Lifestyle  . Physical activity:    Days per week: Not on file    Minutes per session: Not on file  . Stress: Not on file  Relationships  . Social connections:    Talks on phone: Not on file    Gets together: Not on file    Attends religious service: Not on file    Active member of club or organization: Not on file    Attends meetings of clubs or organizations: Not on file    Relationship status: Not on file  . Intimate partner violence:    Fear of current or ex partner: Not  on file    Emotionally abused: Not on file    Physically abused: Not on file    Forced sexual activity: Not on file  Other Topics Concern  . Not on file  Social History Narrative  . Not on file   No family history on file.  Objective: Office vital signs reviewed. BP 134/74   Pulse 80   Temp 97.9 F (36.6 C) (Oral)   Ht 5\' 7"  (1.702 m)   Wt 159 lb (72.1 kg)   BMI 24.90 kg/m   Physical Examination:  General: Awake, alert, well nourished, No acute distress HEENT: Normal, sclera white.  TMs intact bilaterally.  No erythema or dermatitis noted within the external auditory canals. Cardio: regular rate and rhythm, S1S2 heard, no murmurs appreciated Pulm: clear to auscultation bilaterally, no wheezes, rhonchi or rales; normal work of breathing on room air Extremities: warm, well perfused, No edema, cyanosis or clubbing; +2 pulses bilaterally MSK: stiff gait but ambulates independently.      Assessment/ Plan: 73 y.o. female   1. Essential hypertension Blood pressure under excellent control.  No refills needed today.  I have actually advised her to reduce the hydrochlorothiazide to 12.5 mg daily and continue monitoring blood pressure with goal blood pressure of less than 150/90.  Perhaps this will help the orthostasis she is experiencing during the morning time as well as reduced urine output overnight.  2. CKD (chronic kidney disease) stage 3, GFR 30-59 ml/min (HCC) As above.  Continue Micardis.  3. Allergic rhinitis, unspecified seasonality, unspecified trigger Resume use of Zyrtec and Flonase.  No evidence of infection or dermatitis on today's exam - cetirizine (ZYRTEC) 10 MG tablet; Take 0.5 tablets (5 mg total) by mouth daily.  Dispense: 45 tablet; Refill: 3 - fluticasone (FLONASE) 50 MCG/ACT nasal spray; Place 1 spray into both nostrils 2 (two) times daily as needed for allergies or rhinitis.  Dispense: 16 g; Refill: 6  4. Itching of ear As above  5. Spinal stenosis,  lumbar region with neurogenic claudication We will plan to order caudal epidural steroid injection with Dr. Mosetta Putt at [facility] imaging around June/July.  I have scanned in her orders into the EMR.  We will also plan at some point to reestablish with a new specialist for ongoing back needs.   Orders Placed This Encounter  Procedures  . Pneumococcal conjugate vaccine 13-valent   Meds ordered this encounter  Medications  . cetirizine (ZYRTEC) 10 MG tablet    Sig: Take 0.5 tablets (5 mg total) by mouth daily.    Dispense:  45 tablet  Refill:  3  . hydrochlorothiazide (HYDRODIURIL) 25 MG tablet    Sig: Take 0.5-1 tablets (12.5-25 mg total) by mouth daily.    Dispense:  90 tablet    Refill:  3  . pantoprazole (PROTONIX) 20 MG tablet    Sig: Take 1 tablet (20 mg total) by mouth daily.    Dispense:  90 tablet    Refill:  1  . pravastatin (PRAVACHOL) 20 MG tablet    Sig: Take 1 tablet (20 mg total) by mouth daily.    Dispense:  90 tablet    Refill:  1  . telmisartan (MICARDIS) 20 MG tablet    Sig: Take 1 tablet (20 mg total) by mouth daily.    Dispense:  90 tablet    Refill:  1  . fluticasone (FLONASE) 50 MCG/ACT nasal spray    Sig: Place 1 spray into both nostrils 2 (two) times daily as needed for allergies or rhinitis.    Dispense:  16 g    Refill:  6     Vanessa Hulen SkainsM Gottschalk, DO [facility] [phone]

## 2018-03-21 NOTE — Patient Instructions (Signed)
I can assume orders for the back injections after your doctor retires.  We should plan to have you see another specialist at some point if you are interested in surgery.  We will plan to get your bone density test here in July.  Meds have been sent to pharmacy.  REMEMBER:  Take 1/2 tablet of the hydrochlorothiazide.  This will hopefully result in better blood pressures, less low blood pressures and less urination during the nighttime.  Continue the telmisartan (Micardis) 20 mg daily  I have refilled your cetirizine and Flonase.  Start taking these again.  This should help with your itchy ears and sinus symptoms.  If symptoms get worse or do not improve, return for reevaluation.

## 2018-04-18 ENCOUNTER — Ambulatory Visit: Payer: Medicare Other | Admitting: Family Medicine

## 2018-04-22 DIAGNOSIS — H0288A Meibomian gland dysfunction right eye, upper and lower eyelids: Secondary | ICD-10-CM | POA: Diagnosis not present

## 2018-04-22 DIAGNOSIS — Z961 Presence of intraocular lens: Secondary | ICD-10-CM | POA: Diagnosis not present

## 2018-04-22 DIAGNOSIS — H0288B Meibomian gland dysfunction left eye, upper and lower eyelids: Secondary | ICD-10-CM | POA: Diagnosis not present

## 2018-04-22 DIAGNOSIS — G245 Blepharospasm: Secondary | ICD-10-CM | POA: Diagnosis not present

## 2018-04-29 IMAGING — DX DG LUMBAR SPINE 2-3V
3 series · 3 of 3 positions shown · non-contrast
Comparison: None in PACs

CLINICAL DATA: Chronic left leg pain.

EXAM:
LUMBAR SPINE - 2-3 VIEW

[l-spine ap]
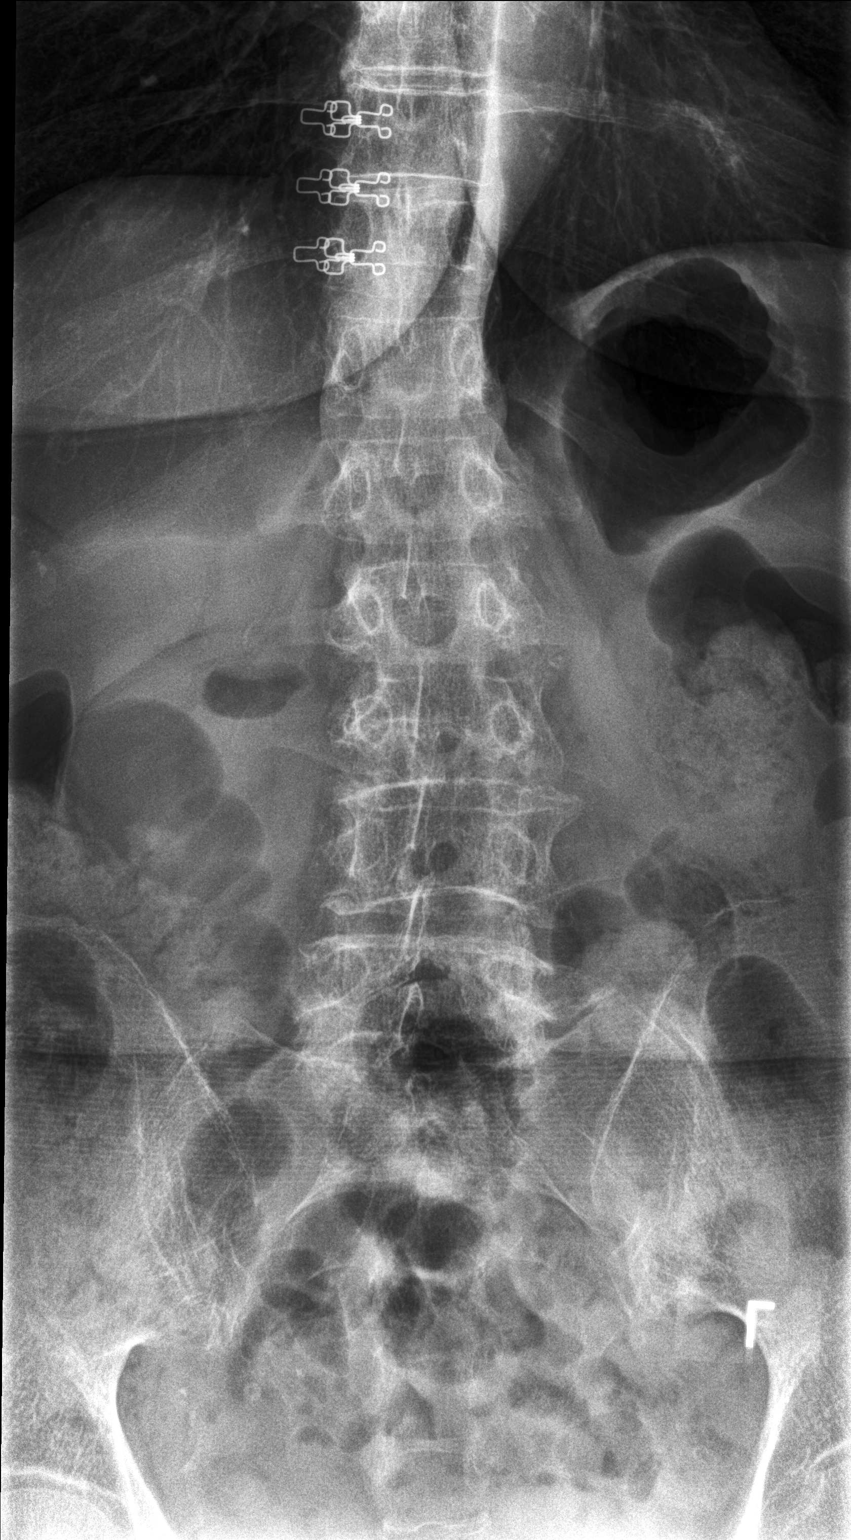

[l-spine lat (1 of 2)]
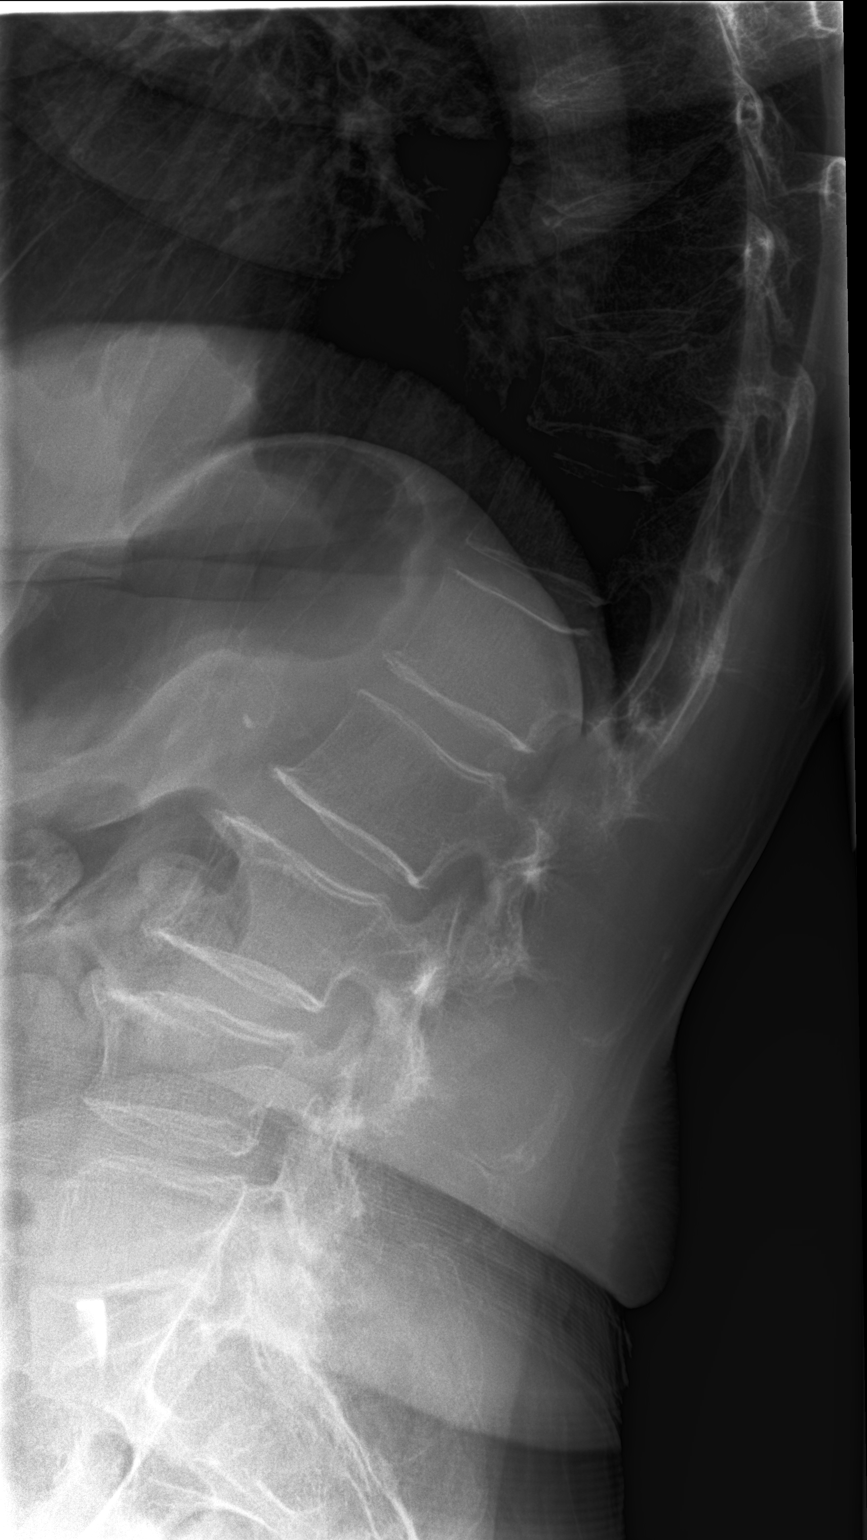

[l-spine lat (2 of 2)]
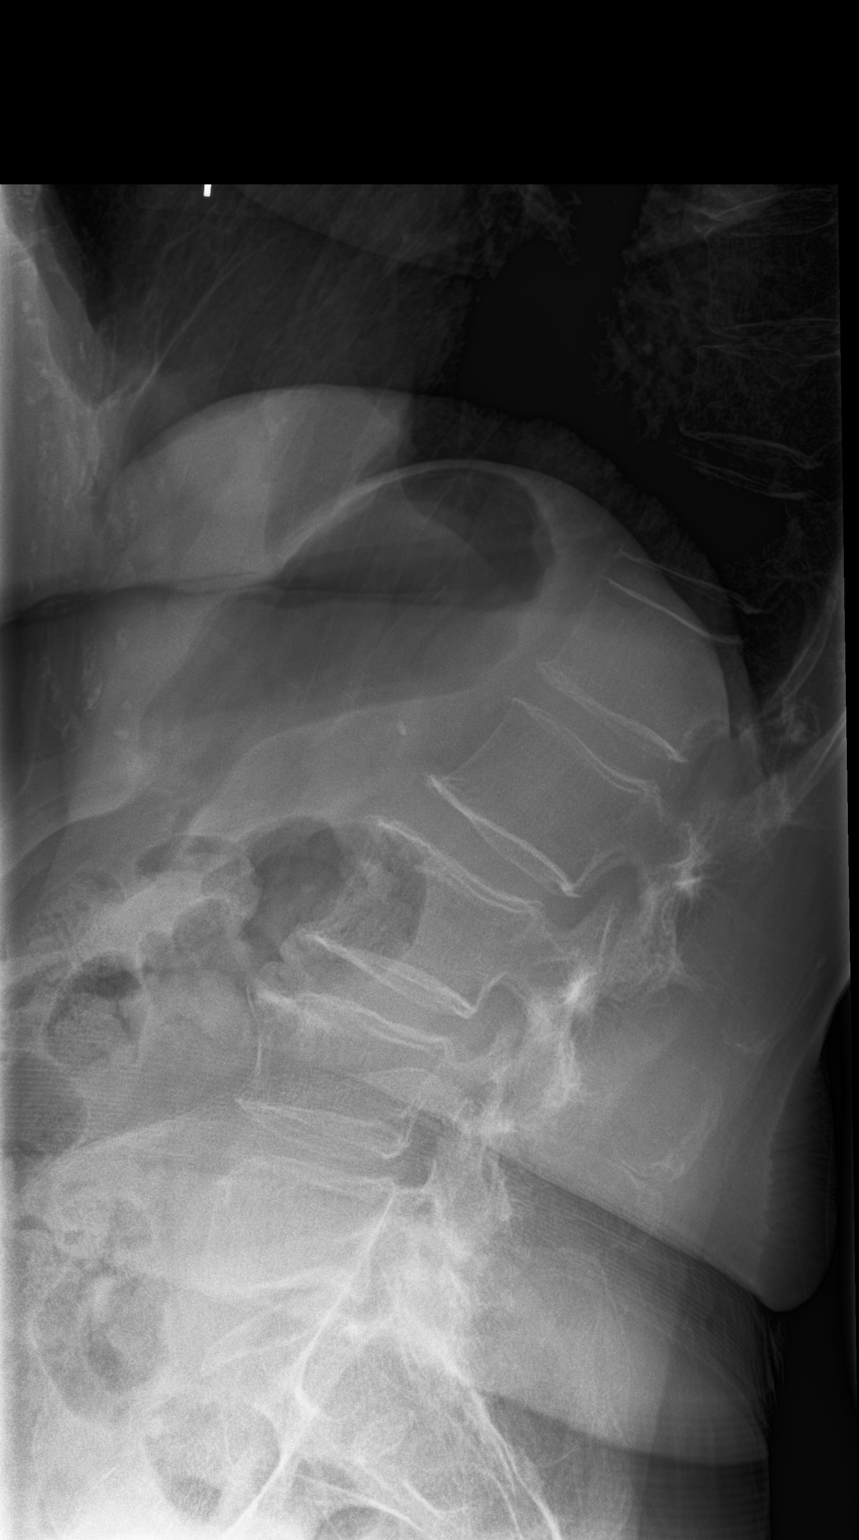

[3 of 3 positions shown; findings below may reference images not displayed]

FINDINGS: There are assumed to be 5 lumbar type non-rib-bearing vertebral
levels. The vertebral bodies are preserved in height. There is mild
disc space narrowing at L4-51. There is facet joint hypertrophy at
L4-5 and L5-S1. There is no spondylolisthesis. There small anterior
endplate osteophytes at L3 and L4. The pedicles and transverse
processes are intact. The observed portions of the sacrum are
grossly normal.
IMPRESSION: Mild degenerative disc space narrowing at L4-5 with anterior
endplate osteophyte formation at L3-4. Mild facet joint hypertrophy
at L4-5 and L5-S1. No acute compression fracture nor other
significant bony abnormality.

## 2018-06-17 ENCOUNTER — Telehealth: Payer: Self-pay | Admitting: Family Medicine

## 2018-06-17 NOTE — Telephone Encounter (Signed)
Pt will need to be seen here for this?  No OV for back pain recently.

## 2018-06-21 ENCOUNTER — Telehealth: Payer: Self-pay

## 2018-06-21 NOTE — Telephone Encounter (Signed)
Please see the 03/21/2018 photo scanned into media tab.  She was getting this injection done by a previous MD who retired.

## 2018-06-21 NOTE — Telephone Encounter (Signed)
Patient called stating that Dr. Reece Agar had a paper from Dr. Channing Mutters that needs to be faxed to [facility]. Patient states it is information about what kind of injection she gets. When asking the patient to explain more she states Dr. Reece Agar has the paper and knows what she is talking about. Please advise and send back to pools.

## 2018-06-27 NOTE — Telephone Encounter (Signed)
Patient has a follow up appointment scheduled. 

## 2018-06-27 NOTE — Telephone Encounter (Signed)
Patient has a follow up appointment scheduled for next week with pcp for back problems.

## 2018-07-06 ENCOUNTER — Other Ambulatory Visit: Payer: Self-pay

## 2018-07-07 ENCOUNTER — Other Ambulatory Visit: Payer: Self-pay | Admitting: Family Medicine

## 2018-07-07 ENCOUNTER — Encounter (INDEPENDENT_AMBULATORY_CARE_PROVIDER_SITE_OTHER): Payer: Self-pay

## 2018-07-07 ENCOUNTER — Ambulatory Visit (INDEPENDENT_AMBULATORY_CARE_PROVIDER_SITE_OTHER): Payer: Medicare Other | Admitting: Family Medicine

## 2018-07-07 VITALS — BP 152/81 | HR 74 | Temp 97.8°F | Ht 67.0 in | Wt 160.0 lb

## 2018-07-07 DIAGNOSIS — M48062 Spinal stenosis, lumbar region with neurogenic claudication: Secondary | ICD-10-CM | POA: Diagnosis not present

## 2018-07-07 DIAGNOSIS — I10 Essential (primary) hypertension: Secondary | ICD-10-CM | POA: Diagnosis not present

## 2018-07-07 DIAGNOSIS — M47816 Spondylosis without myelopathy or radiculopathy, lumbar region: Secondary | ICD-10-CM

## 2018-07-07 DIAGNOSIS — K5909 Other constipation: Secondary | ICD-10-CM

## 2018-07-07 MED ORDER — AMLODIPINE BESYLATE 5 MG PO TABS: 5.0000 mg | ORAL_TABLET | Freq: Every day | ORAL | 0 refills | Status: DC

## 2018-07-07 MED ORDER — LINACLOTIDE 145 MCG PO CAPS: 145.0000 ug | ORAL_CAPSULE | Freq: Every day | ORAL | 0 refills | Status: DC

## 2018-07-07 NOTE — Progress Notes (Signed)
Subjective: CC: f/u back pain PCP: [name], DO [facility] Vanessa Pitts is a 73 y.o. female presenting to clinic today for:  1.  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  She was receiving back injections from Dr. Channing Mutters previously but he has since retired.  She is established now with Dr. Mosetta Putt and plans on seeing them in the next couple of weeks at [facility] imaging for repeat injection.  She is asking that I placed the order in anticipation of this appointment.  She does note that the pain is getting slightly worse but was under excellent control following the last injection.  She has tramadol on hand if needed for breakthrough pain  2.  Constipation Patient reports is a chronic issue that seems exacerbated by use of tramadol.  She has used MiraLAX and Metamucil over-the-counter with little improvement in symptoms.  She is wondering if there is an alternative therapy that she might be able to use.  Denies any bleeding, nausea, vomiting or abdominal pain.  3.  Hypertension Patient reports compliance with Micardis 20 mg daily and hydrochlorothiazide 25 mg daily.  She has taken her medicines today.  She notes that a couple of times per week she feels dizzy in the morning and notes that her blood pressure has dropped.  She thinks that these are days when she has urinated a bunch from the hydrochlorothiazide and perhaps is not replacing her fluids sufficiently.  Blood pressures tend to be controlled at home.   ROS: Per HPI  No Known Allergies Past Medical History:  Diagnosis Date  . Chronically dry eyes   . Hyperlipidemia   . Hypertension   . Leg pain, left   . Osteoporosis   . Ovarian cyst     Current Outpatient Medications:  .  alendronate (FOSAMAX) 70 MG tablet, Take 70 mg by mouth once a week. Take with a full glass of water on an empty stomach., Disp: , Rfl:  .  cetirizine (ZYRTEC) 10 MG tablet, Take 0.5  tablets (5 mg total) by mouth daily., Disp: 45 tablet, Rfl: 3 .  fluticasone (FLONASE) 50 MCG/ACT nasal spray, Place 1 spray into both nostrils 2 (two) times daily as needed for allergies or rhinitis., Disp: 16 g, Rfl: 6 .  pantoprazole (PROTONIX) 20 MG tablet, Take 1 tablet (20 mg total) by mouth daily., Disp: 90 tablet, Rfl: 1 .  pravastatin (PRAVACHOL) 20 MG tablet, Take 1 tablet (20 mg total) by mouth daily., Disp: 90 tablet, Rfl: 1 .  telmisartan (MICARDIS) 20 MG tablet, Take 1 tablet (20 mg total) by mouth daily., Disp: 90 tablet, Rfl: 1 .  amLODipine (NORVASC) 5 MG tablet, Take 1 tablet (5 mg total) by mouth daily., Disp: 90 tablet, Rfl: 0 .  linaclotide (LINZESS) 145 MCG CAPS capsule, Take 1 capsule (145 mcg total) by mouth daily before breakfast., Disp: 30 capsule, Rfl: 0 Social History   Socioeconomic History  . Marital status: Married    Spouse name: Not on file  . Number of children: Not on file  . Years of education: Not on file  . Highest education level: Not on file  Occupational History  . Not on file  Social Needs  . Financial resource strain: Not on file  . Food insecurity:    Worry: Not on file    Inability: Not on file  . Transportation needs:    Medical: Not on file    Non-medical: Not  on file  Tobacco Use  . Smoking status: Never Smoker  . Smokeless tobacco: Never Used  Substance and Sexual Activity  . Alcohol use: No  . Drug use: No  . Sexual activity: Not on file  Lifestyle  . Physical activity:    Days per week: Not on file    Minutes per session: Not on file  . Stress: Not on file  Relationships  . Social connections:    Talks on phone: Not on file    Gets together: Not on file    Attends religious service: Not on file    Active member of club or organization: Not on file    Attends meetings of clubs or organizations: Not on file    Relationship status: Not on file  . Intimate partner violence:    Fear of current or ex partner: Not on file     Emotionally abused: Not on file    Physically abused: Not on file    Forced sexual activity: Not on file  Other Topics Concern  . Not on file  Social History Narrative  . Not on file   No family history on file.  Objective: Office vital signs reviewed. BP (!) 152/81   Pulse 74   Temp 97.8 F (36.6 C) (Oral)   Ht 5\' 7"  (1.702 m)   Wt 160 lb (72.6 kg)   BMI 25.06 kg/m   Physical Examination:  General: Awake, alert, well nourished, No acute distress HEENT: Normal, sclera white, MMM Cardio: regular rate and rhythm, S1S2 heard, no murmurs appreciated Pulm: clear to auscultation bilaterally, no wheezes, rhonchi or rales; normal work of breathing on room air Extremities: warm, well perfused, No edema, cyanosis or clubbing; +2 pulses bilaterally MSK: Antalgic gait and station  Assessment/ Plan: 73 y.o. female   1. Essential hypertension Not controlled.  We discussed considering switching her medication particularly since she is having dizzy spells.  Discontinue hydrochlorothiazide.  Start Norvasc 5 mg daily.  She is to monitor blood pressures daily with goal of less than 140/90.  If she finds that they are going above this she will contact me and we will escalate therapy.  Continue Micardis.  2. Spinal stenosis, lumbar region with neurogenic claudication I have faxed over the order to Dr. [facility]Grady at [facility].  3. Chronic constipation I have given her a week worth of samples of the Linzess.  I am unsure if this will be covered by her insurance but if she has trouble affording it she may consider applying for medication assistance.  Alternatively, we could consider prescribing Amitiza which may be covered. - linaclotide (LINZESS) 145 MCG CAPS capsule; Take 1 capsule (145 mcg total) by mouth daily before breakfast.  Dispense: 30 capsule; Refill: 0   No orders of the defined types were placed in this encounter.  Meds ordered this encounter  Medications  . amLODipine  (NORVASC) 5 MG tablet    Sig: Take 1 tablet (5 mg total) by mouth daily.    Dispense:  90 tablet    Refill:  0  . linaclotide (LINZESS) 145 MCG CAPS capsule    Sig: Take 1 capsule (145 mcg total) by mouth daily before breakfast.    Dispense:  30 capsule    Refill:  0     Ashly Hulen SkainsM Gottschalk, DO [facility] [phone]

## 2018-07-07 NOTE — Patient Instructions (Signed)
STOP Hydrochlorothiazide.  START Amlodipine. Keep an eye on blood pressures.  If you start seeing BPs higher than 140/90, call me.  I will get the order to your back doctor.

## 2018-07-15 ENCOUNTER — Telehealth: Payer: Self-pay | Admitting: Family Medicine

## 2018-07-15 ENCOUNTER — Ambulatory Visit: Payer: Medicare Other

## 2018-07-19 ENCOUNTER — Ambulatory Visit
Admission: RE | Admit: 2018-07-19 | Discharge: 2018-07-19 | Disposition: A | Discharge status: Home / Self Care | Payer: Medicare Other | Source: Ambulatory Visit | Attending: Family Medicine | Admitting: Family Medicine

## 2018-07-19 ENCOUNTER — Other Ambulatory Visit: Payer: Self-pay

## 2018-07-19 DIAGNOSIS — M47816 Spondylosis without myelopathy or radiculopathy, lumbar region: Secondary | ICD-10-CM

## 2018-07-19 DIAGNOSIS — M545 Low back pain: Secondary | ICD-10-CM | POA: Diagnosis not present

## 2018-07-19 DIAGNOSIS — M79605 Pain in left leg: Secondary | ICD-10-CM | POA: Diagnosis not present

## 2018-07-19 MED ORDER — IOPAMIDOL (ISOVUE-M 200) INJECTION 41%
1.0000 mL | Freq: Once | INTRAMUSCULAR | Status: AC
  Administered 2018-07-19: 1 mL via EPIDURAL

## 2018-07-19 MED ORDER — METHYLPREDNISOLONE ACETATE 40 MG/ML INJ SUSP (RADIOLOG
120.0000 mg | Freq: Once | INTRAMUSCULAR | Status: AC
  Administered 2018-07-19: 120 mg via EPIDURAL

## 2018-07-19 NOTE — Discharge Instructions (Signed)

## 2018-08-08 ENCOUNTER — Ambulatory Visit: Payer: Medicare Other | Admitting: Family Medicine

## 2018-08-24 ENCOUNTER — Ambulatory Visit: Payer: Medicare Other | Admitting: *Deleted

## 2018-09-13 ENCOUNTER — Ambulatory Visit (INDEPENDENT_AMBULATORY_CARE_PROVIDER_SITE_OTHER): Payer: Medicare Other | Admitting: *Deleted

## 2018-09-13 ENCOUNTER — Encounter: Payer: Self-pay | Admitting: *Deleted

## 2018-09-13 DIAGNOSIS — Z0000 Encounter for general adult medical examination without abnormal findings: Secondary | ICD-10-CM | POA: Diagnosis not present

## 2018-09-13 NOTE — Progress Notes (Signed)
MEDICARE ANNUAL WELLNESS VISIT  09/13/2018  Telephone Visit Disclaimer This Medicare AWV was conducted by telephone due to national recommendations for restrictions regarding the COVID-19 Pandemic (e.g. social distancing).  I verified, using two identifiers, that I am speaking with Vanessa Pitts Schnyder or their authorized healthcare agent. I discussed the limitations, risks, security, and privacy concerns of performing an evaluation and management service by telephone and the potential availability of an in-person appointment in the future. The patient expressed understanding and agreed to proceed.   Subjective:  Vanessa Pitts Dimmick is a 73 y.o. female patient of [name], [name], DO who had a Medicare Annual Wellness Visit today via telephone. [name] is Retired and lives with their spouse. she has 3 children. she reports that she is socially active and does interact with friends/family regularly. she is minimally physically active and enjoys doing jigsaw puzzles and to crochet.  Patient Care Team: [name], [name], DO as PCP - General (Family Medicine)  Advanced Directives 09/13/2018  Does Patient Have a Medical Advance Directive? No  Would patient like information on creating a medical advance directive? No - Patient declined    Hospital Utilization Over the Past 12 Months: # of hospitalizations or ER visits: 0 # of surgeries: 0  Review of Systems    Patient reports that her overall health is unchanged compared to last year.  Patient Reported Readings (BP, Pulse, CBG, Weight, etc) none  Review of Systems: No complaints  All other systems negative.  Pain Assessment Pain : No/denies pain     Current Medications & Allergies (verified) Allergies as of 09/13/2018   No Known Allergies     Medication List       Accurate as of September 13, 2018 10:27 AM. If you have any questions, ask your nurse or doctor.        STOP taking these medications   linaclotide 145 MCG Caps capsule  Commonly known as: Linzess Stopped by: WYATT, AMY M, RN     TAKE these medications   alendronate 70 MG tablet Commonly known as: FOSAMAX Take 70 mg by mouth once a week. Take with a full glass of water on an empty stomach.   amLODipine 5 MG tablet Commonly known as: NORVASC Take 1 tablet (5 mg total) by mouth daily.   cetirizine 10 MG tablet Commonly known as: ZYRTEC Take 0.5 tablets (5 mg total) by mouth daily.   doxycycline 50 MG capsule Commonly known as: MONODOX Take 50 mg by mouth daily.   fluticasone 50 MCG/ACT nasal spray Commonly known as: FLONASE Place 1 spray into both nostrils 2 (two) times daily as needed for allergies or rhinitis.   pantoprazole 20 MG tablet Commonly known as: Protonix Take 1 tablet (20 mg total) by mouth daily.   pravastatin 20 MG tablet Commonly known as: PRAVACHOL Take 1 tablet (20 mg total) by mouth daily.   telmisartan 20 MG tablet Commonly known as: Micardis Take 1 tablet (20 mg total) by mouth daily.   traMADol 50 MG tablet Commonly known as: ULTRAM Take 50 mg by mouth every 6 (six) hours as needed.       History (reviewed): Past Medical History:  Diagnosis Date  . Chronically dry eyes   . Hyperlipidemia   . Hypertension   . Leg pain, left   . Osteoporosis   . Ovarian cyst    Past Surgical History:  Procedure Laterality Date  . cataract Bilateral 2017   Family History  Problem Relation Age  of Onset  . Heart disease Mother   . Diabetes Mother   . Hyperlipidemia Mother   . Hypertension Mother   . Arthritis Mother   . Obesity Mother   . Arthritis Father    Social History   Socioeconomic History  . Marital status: Married    Spouse name: [name]  . Number of children: 3  . Years of education: 58  . Highest education level: Some college, no degree  Occupational History  . Occupation: retired  Scientific laboratory technician  . Financial resource strain: Not hard at all  . Food insecurity    Worry: Never true    Inability:  Never true  . Transportation needs    Medical: No    Non-medical: No  Tobacco Use  . Smoking status: Never Smoker  . Smokeless tobacco: Never Used  Substance and Sexual Activity  . Alcohol use: No  . Drug use: No  . Sexual activity: Yes    Birth control/protection: Post-menopausal  Lifestyle  . Physical activity    Days per week: 7 days    Minutes per session: 20 min  . Stress: Not at all  Relationships  . Social connections    Talks on phone: More than three times a week    Gets together: More than three times a week    Attends religious service: Never    Active member of club or organization: No    Attends meetings of clubs or organizations: Never    Relationship status: Married  Other Topics Concern  . Not on file  Social History Narrative  . Not on file    Activities of Daily Living In your present state of health, do you have any difficulty performing the following activities: 09/13/2018  Hearing? Y  Comment wears bilateral hearing aids x5 years  Vision? N  Difficulty concentrating or making decisions? N  Walking or climbing stairs? Y  Comment due to her leg pain, she can use stairs but has to use rails and takes her time  Dressing or bathing? N  Doing errands, shopping? N  Preparing Food and eating ? N  Using the Toilet? N  In the past six months, have you accidently leaked urine? N  Do you have problems with loss of bowel control? N  Managing your Medications? N  Managing your Finances? N  Housekeeping or managing your Housekeeping? N  Some recent data might be hidden    Patient Education/ Literacy How often do you need to have someone help you when you read instructions, pamphlets, or other written materials from your doctor or pharmacy?: 1 - Never What is the last grade level you completed in school?: graduated highschool, some college no degree  Exercise Current Exercise Habits: Home exercise routine, Type of exercise: Other - see comments(Home PT  exercises), Time (Minutes): 20, Frequency (Times/Week): 7, Weekly Exercise (Minutes/Week): 140, Intensity: Mild, Exercise limited by: orthopedic condition(s)  Diet Patient reports consuming 3 meals a day and 2 snack(s) a day Patient reports that her primary diet is: Regular Patient reports that she does have regular access to food.   Depression Screen PHQ 2/9 Scores 09/13/2018 07/07/2018 03/21/2018 10/18/2017 09/20/2017 08/18/2017 07/26/2017  PHQ - 2 Score 0 0 0 0 0 0 0  PHQ- 9 Score - 0 0 - - - -     Fall Risk Fall Risk  09/13/2018 07/07/2018 03/21/2018 10/18/2017 09/20/2017  Falls in the past year? 0 0 0 No No  Comment - - - - -  Objective:  Vanessa Pitts Norenberg seemed alert and oriented and she participated appropriately during our telephone visit.  Blood Pressure Weight BMI  BP Readings from Last 3 Encounters:  07/19/18 (!) 167/88  07/07/18 (!) 152/81  03/21/18 134/74   Wt Readings from Last 3 Encounters:  07/07/18 160 lb (72.6 kg)  03/21/18 159 lb (72.1 kg)  10/18/17 162 lb (73.5 kg)   BMI Readings from Last 1 Encounters:  07/07/18 25.06 kg/m    *Unable to obtain current vital signs, weight, and BMI due to telephone visit type  Hearing/Vision  . [name] did not seem to have difficulty with hearing/understanding during the telephone conversation . Reports that she has not had a formal eye exam by an eye care professional within the past year . Reports that she has not had a formal hearing evaluation within the past year *Unable to fully assess hearing and vision during telephone visit type  Cognitive Function: 6CIT Screen 09/13/2018  What Year? 0 points  What month? 0 points  What time? 0 points  Count back from 20 0 points  Months in reverse 0 points  Repeat phrase 2 points  Total Score 2   (Normal:0-7, Significant for Dysfunction: >8)  Normal Cognitive Function Screening: Yes   Immunization & Health Maintenance Record Immunization History  Administered Date(s) Administered  .  Influenza, High Dose Seasonal PF 11/22/2017  . Pneumococcal Conjugate-13 03/21/2018    Health Maintenance  Topic Date Due  . TETANUS/TDAP  12/16/1964  . MAMMOGRAM  12/02/2017  . INFLUENZA VACCINE  09/10/2018  . COLONOSCOPY  10/19/2018 (Originally 12/17/1995)  . PNA vac Low Risk Adult (2 of 2 - PPSV23) 03/22/2019  . DEXA SCAN  Completed  . Hepatitis C Screening  Completed       Assessment  This is a routine wellness examination for [facility].  Health Maintenance: Due or Overdue Health Maintenance Due  Topic Date Due  . TETANUS/TDAP  12/16/1964  . MAMMOGRAM  12/02/2017  . INFLUENZA VACCINE  09/10/2018    Vanessa Pitts Ferrante does not need a referral for Community Assistance: Care Management:   no Social Work:    no Prescription Assistance:  no Nutrition/Diabetes Education:  no   Plan:  Personalized Goals Goals Addressed            This Visit's Progress   . DIET - INCREASE WATER INTAKE       Try to drink 6-8 glasses of water daily.      Personalized Health Maintenance & Screening Recommendations  Td vaccine Screening mammography Shingles vaccine  Lung Cancer Screening Recommended: no (Low Dose CT Chest recommended if Age 40-80 years, 30 pack-year currently smoking OR have quit w/in past 15 years) Hepatitis C Screening recommended: no HIV Screening recommended: no  Advanced Directives: Written information was not prepared per patient's request.  Referrals & Orders No orders of the defined types were placed in this encounter.   Follow-up Plan . Follow-up with [name], [name], DO as planned . Schedule your screening Mammogram as discussed . Due for Pneumovax23 after 03/22/2019 . Consider TDAP and Shingles vaccines at your next visit with your PCP if you can afford   I have personally reviewed and noted the following in the patient's chart:   . Medical and social history . Use of alcohol, tobacco or illicit drugs  . Current medications and supplements .  Functional ability and status . Nutritional status . Physical activity . Advanced directives . List of other physicians . Hospitalizations, surgeries,  and ER visits in previous 12 months . Vitals . Screenings to include cognitive, depression, and falls . Referrals and appointments  In addition, I have reviewed and discussed with Vanessa Pitts Lanuza certain preventive protocols, quality metrics, and best practice recommendations. A written personalized care plan for preventive services as well as general preventive health recommendations is available and can be mailed to the patient at her request.      Margurite AuerbachCompton,  G, LPN  3/0/86578/05/2018

## 2018-09-13 NOTE — Patient Instructions (Signed)
Preventive Care 8 Years and Older, Female Preventive care refers to lifestyle choices and visits with your health care provider that can promote health and wellness. This includes:  A yearly physical exam. This is also called an annual well check.  Regular dental and eye exams.  Immunizations.  Screening for certain conditions.  Healthy lifestyle choices, such as diet and exercise. What can I expect for my preventive care visit? Physical exam Your health care provider will check:  Height and weight. These may be used to calculate body mass index (BMI), which is a measurement that tells if you are at a healthy weight.  Heart rate and blood pressure.  Your skin for abnormal spots. Counseling Your health care provider may ask you questions about:  Alcohol, tobacco, and drug use.  Emotional well-being.  Home and relationship well-being.  Sexual activity.  Eating habits.  History of falls.  Memory and ability to understand (cognition).  Work and work Statistician.  Pregnancy and menstrual history. What immunizations do I need?  Influenza (flu) vaccine  This is recommended every year. Tetanus, diphtheria, and pertussis (Tdap) vaccine  You may need a Td booster every 10 years. Varicella (chickenpox) vaccine  You may need this vaccine if you have not already been vaccinated. Zoster (shingles) vaccine  You may need this after age 46. Pneumococcal conjugate (PCV13) vaccine  One dose is recommended after age 34. Pneumococcal polysaccharide (PPSV23) vaccine  One dose is recommended after age 77. Measles, mumps, and rubella (MMR) vaccine  You may need at least one dose of MMR if you were born in 1957 or later. You may also need a second dose. Meningococcal conjugate (MenACWY) vaccine  You may need this if you have certain conditions. Hepatitis A vaccine  You may need this if you have certain conditions or if you travel or work in places where you may be exposed  to hepatitis A. Hepatitis B vaccine  You may need this if you have certain conditions or if you travel or work in places where you may be exposed to hepatitis B. Haemophilus influenzae type b (Hib) vaccine  You may need this if you have certain conditions. You may receive vaccines as individual doses or as more than one vaccine together in one shot (combination vaccines). Talk with your health care provider about the risks and benefits of combination vaccines. What tests do I need? Blood tests  Lipid and cholesterol levels. These may be checked every 5 years, or more frequently depending on your overall health.  Hepatitis C test.  Hepatitis B test. Screening  Lung cancer screening. You may have this screening every year starting at age 35 if you have a 30-pack-year history of smoking and currently smoke or have quit within the past 15 years.  Colorectal cancer screening. All adults should have this screening starting at age 89 and continuing until age 58. Your health care provider may recommend screening at age 72 if you are at increased risk. You will have tests every 1-10 years, depending on your results and the type of screening test.  Diabetes screening. This is done by checking your blood sugar (glucose) after you have not eaten for a while (fasting). You may have this done every 1-3 years.  Mammogram. This may be done every 1-2 years. Talk with your health care provider about how often you should have regular mammograms.  BRCA-related cancer screening. This may be done if you have a family history of breast, ovarian, tubal, or peritoneal cancers.  Other tests  Sexually transmitted disease (STD) testing.  Bone density scan. This is done to screen for osteoporosis. You may have this done starting at age 76. Follow these instructions at home: Eating and drinking  Eat a diet that includes fresh fruits and vegetables, whole grains, lean protein, and low-fat dairy products. Limit  your intake of foods with high amounts of sugar, saturated fats, and salt.  Take vitamin and mineral supplements as recommended by your health care provider.  Do not drink alcohol if your health care provider tells you not to drink.  If you drink alcohol: ? Limit how much you have to 0-1 drink a day. ? Be aware of how much alcohol is in your drink. In the [place] one drink equals one 12 oz bottle of beer (355 mL), one 5 oz glass of wine (148 mL), or one 1 oz glass of hard liquor (44 mL). Lifestyle  Take daily care of your teeth and gums.  Stay active. Exercise for at least 30 minutes on 5 or more days each week.  Do not use any products that contain nicotine or tobacco, such as cigarettes, e-cigarettes, and chewing tobacco. If you need help quitting, ask your health care provider.  If you are sexually active, practice safe sex. Use a condom or other form of protection in order to prevent STIs (sexually transmitted infections).  Talk with your health care provider about taking a low-dose aspirin or statin. What's next?  Go to your health care provider once a year for a well check visit.  Ask your health care provider how often you should have your eyes and teeth checked.  Stay up to date on all vaccines. This information is not intended to replace advice given to you by your health care provider. Make sure you discuss any questions you have with your health care provider. Document Released: 02/22/2015 Document Revised: 01/20/2018 Document Reviewed: 01/20/2018 Elsevier Patient Education  2020 [employer].

## 2018-09-19 ENCOUNTER — Ambulatory Visit (INDEPENDENT_AMBULATORY_CARE_PROVIDER_SITE_OTHER): Payer: Medicare Other | Admitting: Family Medicine

## 2018-09-19 ENCOUNTER — Encounter: Payer: Self-pay | Admitting: Family Medicine

## 2018-09-19 VITALS — BP 121/74

## 2018-09-19 DIAGNOSIS — I10 Essential (primary) hypertension: Secondary | ICD-10-CM

## 2018-09-19 MED ORDER — AMLODIPINE BESYLATE 2.5 MG PO TABS: 2.5000 mg | ORAL_TABLET | Freq: Every day | ORAL | 0 refills | Status: DC

## 2018-09-19 NOTE — Progress Notes (Signed)
Telephone visit  Subjective: CC:f/u HTN PCP: Janora Norlander, DO TIR:Vanessa Pitts is a 73 y.o. female calls for telephone consult today. Patient provides verbal consent for consult held via phone.  Location of patient: home Location of provider: Working remotely from home Others present for call: none  1. Hypertension She reports that her BPs have been good at home.  She reports BP was 121/74 today and was 143/73 yesterday.  She does report a few hypotensive episodes with systolic in the 54M since discontinuing the HCTZ.  No LOC, CP, SOB, Edema. No falls.    ROS: Per HPI  No Known Allergies Past Medical History:  Diagnosis Date  . Chronically dry eyes   . Hyperlipidemia   . Hypertension   . Leg pain, left   . Osteoporosis   . Ovarian cyst     Current Outpatient Medications:  .  alendronate (FOSAMAX) 70 MG tablet, Take 70 mg by mouth once a week. Take with a full glass of water on an empty stomach., Disp: , Rfl:  .  amLODipine (NORVASC) 5 MG tablet, Take 1 tablet (5 mg total) by mouth daily., Disp: 90 tablet, Rfl: 0 .  cetirizine (ZYRTEC) 10 MG tablet, Take 0.5 tablets (5 mg total) by mouth daily., Disp: 45 tablet, Rfl: 3 .  doxycycline (MONODOX) 50 MG capsule, Take 50 mg by mouth daily., Disp: , Rfl:  .  fluticasone (FLONASE) 50 MCG/ACT nasal spray, Place 1 spray into both nostrils 2 (two) times daily as needed for allergies or rhinitis., Disp: 16 g, Rfl: 6 .  pantoprazole (PROTONIX) 20 MG tablet, Take 1 tablet (20 mg total) by mouth daily., Disp: 90 tablet, Rfl: 1 .  pravastatin (PRAVACHOL) 20 MG tablet, Take 1 tablet (20 mg total) by mouth daily., Disp: 90 tablet, Rfl: 1 .  telmisartan (MICARDIS) 20 MG tablet, Take 1 tablet (20 mg total) by mouth daily., Disp: 90 tablet, Rfl: 1 .  traMADol (ULTRAM) 50 MG tablet, Take 50 mg by mouth every 6 (six) hours as needed., Disp: , Rfl:   Assessment/ Plan: 73 y.o. female   1. Essential hypertension Continues to have intermittent  hypotensive episodes that are symptomatic.  I have instructed her to reduce the amlodipine to 2.5 mg daily.  Continue telmisartan 20 mg daily.  Continue to monitor blood pressures with goal blood pressures less than 150/90.  She is to contact me if she has ongoing hypotensive episodes at which point we will consider eliminating the amlodipine totally. - amLODipine (NORVASC) 2.5 MG tablet; Take 1 tablet (2.5 mg total) by mouth daily.  Dispense: 90 tablet; Refill: 0   Start time: 12:45 End time: 12:51p  Total time spent on patient care (including telephone call/ virtual visit): 10 minutes  [place], [place] [phone]

## 2018-09-27 ENCOUNTER — Telehealth: Payer: Self-pay | Admitting: Family Medicine

## 2018-09-27 MED ORDER — ALENDRONATE SODIUM 70 MG PO TABS: 70.0000 mg | ORAL_TABLET | ORAL | 0 refills | Status: DC

## 2018-09-27 NOTE — Telephone Encounter (Signed)
Patient requesting fosamax.  Refill sent

## 2018-09-27 NOTE — Telephone Encounter (Signed)
Ok to provide 30 days.  Further fills from Dr Carloyn Manner.

## 2018-10-07 DIAGNOSIS — H0288A Meibomian gland dysfunction right eye, upper and lower eyelids: Secondary | ICD-10-CM | POA: Diagnosis not present

## 2018-10-07 DIAGNOSIS — Z961 Presence of intraocular lens: Secondary | ICD-10-CM | POA: Diagnosis not present

## 2018-10-07 DIAGNOSIS — G245 Blepharospasm: Secondary | ICD-10-CM | POA: Diagnosis not present

## 2018-10-07 DIAGNOSIS — H0288B Meibomian gland dysfunction left eye, upper and lower eyelids: Secondary | ICD-10-CM | POA: Diagnosis not present

## 2018-10-17 ENCOUNTER — Other Ambulatory Visit: Payer: Self-pay | Admitting: Family Medicine

## 2018-10-21 ENCOUNTER — Other Ambulatory Visit: Payer: Self-pay | Admitting: Family Medicine

## 2018-10-28 ENCOUNTER — Telehealth: Payer: Self-pay | Admitting: Family Medicine

## 2018-10-31 NOTE — Telephone Encounter (Signed)
rx given to [facility] to fax.

## 2018-11-10 ENCOUNTER — Telehealth: Payer: Self-pay | Admitting: Family Medicine

## 2018-11-11 ENCOUNTER — Ambulatory Visit (INDEPENDENT_AMBULATORY_CARE_PROVIDER_SITE_OTHER): Payer: Medicare Other

## 2018-11-11 ENCOUNTER — Other Ambulatory Visit: Payer: Self-pay

## 2018-11-11 DIAGNOSIS — Z23 Encounter for immunization: Secondary | ICD-10-CM | POA: Diagnosis not present

## 2018-11-11 NOTE — Telephone Encounter (Signed)
I have done another rx.  Please call to ensure receipt and place in her EMR for documentation.

## 2018-11-11 NOTE — Telephone Encounter (Signed)
I remember faxing and making a copy of conformation. I don/t understand. I guess we need to do again

## 2018-11-11 NOTE — Telephone Encounter (Signed)
This was done a while ago.  Barnett Applebaum can you check on this?

## 2018-11-14 ENCOUNTER — Other Ambulatory Visit: Payer: Self-pay | Admitting: Family Medicine

## 2018-11-14 DIAGNOSIS — M47817 Spondylosis without myelopathy or radiculopathy, lumbosacral region: Secondary | ICD-10-CM

## 2018-11-17 ENCOUNTER — Ambulatory Visit
Admission: RE | Admit: 2018-11-17 | Discharge: 2018-11-17 | Disposition: A | Discharge status: Home / Self Care | Payer: Medicare Other | Source: Ambulatory Visit | Attending: Family Medicine | Admitting: Family Medicine

## 2018-11-17 DIAGNOSIS — M47817 Spondylosis without myelopathy or radiculopathy, lumbosacral region: Secondary | ICD-10-CM

## 2018-11-17 DIAGNOSIS — M545 Low back pain: Secondary | ICD-10-CM | POA: Diagnosis not present

## 2018-11-17 MED ORDER — METHYLPREDNISOLONE ACETATE 40 MG/ML INJ SUSP (RADIOLOG
120.0000 mg | Freq: Once | INTRAMUSCULAR | Status: AC
  Administered 2018-11-17: 120 mg via EPIDURAL

## 2018-11-17 MED ORDER — IOPAMIDOL (ISOVUE-M 200) INJECTION 41%
1.0000 mL | Freq: Once | INTRAMUSCULAR | Status: AC
  Administered 2018-11-17: 1 mL via EPIDURAL

## 2018-11-17 NOTE — Discharge Instructions (Signed)

## 2018-11-23 ENCOUNTER — Other Ambulatory Visit: Payer: Self-pay | Admitting: Family Medicine

## 2018-12-23 ENCOUNTER — Other Ambulatory Visit: Payer: Self-pay | Admitting: Family Medicine

## 2018-12-26 NOTE — Telephone Encounter (Signed)
Pt aware of provider feedback and voiced understanding. 

## 2018-12-26 NOTE — Telephone Encounter (Signed)
There are no rx meds for gas.  Recommend avoiding foods that cause excess gas (dairy, etc).

## 2019-01-06 ENCOUNTER — Other Ambulatory Visit: Payer: Self-pay | Admitting: Family Medicine

## 2019-01-11 ENCOUNTER — Other Ambulatory Visit: Payer: Self-pay | Admitting: Family Medicine

## 2019-01-25 ENCOUNTER — Telehealth: Payer: Self-pay | Admitting: Family Medicine

## 2019-01-25 NOTE — Telephone Encounter (Signed)
VM box full

## 2019-02-20 ENCOUNTER — Ambulatory Visit (INDEPENDENT_AMBULATORY_CARE_PROVIDER_SITE_OTHER): Payer: Medicare Other | Admitting: Family Medicine

## 2019-02-20 ENCOUNTER — Encounter: Payer: Self-pay | Admitting: Family Medicine

## 2019-02-20 VITALS — BP 157/91 | HR 63 | Temp 97.5°F | Ht 67.0 in | Wt 160.0 lb

## 2019-02-20 DIAGNOSIS — M48062 Spinal stenosis, lumbar region with neurogenic claudication: Secondary | ICD-10-CM

## 2019-02-20 DIAGNOSIS — Z79899 Other long term (current) drug therapy: Secondary | ICD-10-CM

## 2019-02-20 MED ORDER — TRAMADOL HCL 50 MG PO TABS: 50.0000 mg | ORAL_TABLET | Freq: Two times a day (BID) | ORAL | 1 refills | Status: DC | PRN

## 2019-02-20 NOTE — Progress Notes (Addendum)
Subjective: CC: Chronic pain PCP: Janora Norlander, DO Vanessa Pitts is a 74 y.o. female presenting to clinic today for:  1.  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  Pain often radiates the left lower leg.  Current pain level 3/10.  Max pain level 8/10 (she will take a Tramadol at this level).  She is due for repeat injection soon but worries that with the changes in Medicare this may not be covered, she cites that her recent Botox injection was denied and required preauthorization.  She has tramadol on hand from a couple of years ago to use as needed.  She knows that she uses this extremely sparingly but wants to know if this is something I might be able to send in for her to have on hand with the uncertainty of the epidurals as above.  She denies any history of seizure activity.  No excessive daytime sleepiness with the medicine.  No constipation.  No respiratory depression.   ROS: Per HPI  No Known Allergies Past Medical History:  Diagnosis Date  . Chronically dry eyes   . Hyperlipidemia   . Hypertension   . Leg pain, left   . Osteoporosis   . Ovarian cyst     Current Outpatient Medications:  .  alendronate (FOSAMAX) 70 MG tablet, TAKE 1 TABLET BY MOUTH ONCE A WEEK. TAKE WITH FULL GLASS OF WATER ON AN EMPTY STOMACH, Disp: 12 tablet, Rfl: 0 .  amLODipine (NORVASC) 2.5 MG tablet, Take 1 tablet (2.5 mg total) by mouth daily., Disp: 90 tablet, Rfl: 0 .  cetirizine (ZYRTEC) 10 MG tablet, Take 0.5 tablets (5 mg total) by mouth daily., Disp: 45 tablet, Rfl: 3 .  doxycycline (MONODOX) 50 MG capsule, Take 50 mg by mouth daily., Disp: , Rfl:  .  fluticasone (FLONASE) 50 MCG/ACT nasal spray, Place 1 spray into both nostrils 2 (two) times daily as needed for allergies or rhinitis., Disp: 16 g, Rfl: 6 .  pantoprazole (PROTONIX) 20 MG tablet, Take 1 tablet by mouth once  daily, Disp: 90 tablet, Rfl: 0 .  pravastatin (PRAVACHOL) 20 MG tablet, Take 1 tablet (20 mg total) by mouth daily., Disp: 90 tablet, Rfl: 1 .  telmisartan (MICARDIS) 20 MG tablet, Take 1 tablet by mouth once daily, Disp: 90 tablet, Rfl: 1 .  traMADol (ULTRAM) 50 MG tablet, Take 50 mg by mouth every 6 (six) hours as needed., Disp: , Rfl:  Social History   Socioeconomic History  . Marital status: Married    Spouse name: [name]  . Number of children: 3  . Years of education: 52  . Highest education level: Some college, no degree  Occupational History  . Occupation: retired  Tobacco Use  . Smoking status: Never Smoker  . Smokeless tobacco: Never Used  Substance and Sexual Activity  . Alcohol use: No  . Drug use: No  . Sexual activity: Yes    Birth control/protection: Post-menopausal  Other Topics Concern  . Not on file  Social History Narrative  . Not on file   Social Determinants of Health   Financial Resource Strain: Low Risk   . Difficulty of Paying Living Expenses: Not hard at all  Food Insecurity: No Food Insecurity  . Worried About Charity fundraiser in the Last Year: Never true  . Ran Out of Food in the Last Year: Never true  Transportation Needs:  No Transportation Needs  . Lack of Transportation (Medical): No  . Lack of Transportation (Non-Medical): No  Physical Activity: Insufficiently Active  . Days of Exercise per Week: 7 days  . Minutes of Exercise per Session: 20 min  Stress: No Stress Concern Present  . Feeling of Stress : Not at all  Social Connections: Somewhat Isolated  . Frequency of Communication with Friends and Family: More than three times a week  . Frequency of Social Gatherings with Friends and Family: More than three times a week  . Attends Religious Services: Never  . Active Member of Clubs or Organizations: No  . Attends Banker Meetings: Never  . Marital Status: Married  Catering manager Violence: Not At Risk  . Fear of Current  or Ex-Partner: No  . Emotionally Abused: No  . Physically Abused: No  . Sexually Abused: No   Family History  Problem Relation Age of Onset  . Heart disease Mother   . Diabetes Mother   . Hyperlipidemia Mother   . Hypertension Mother   . Arthritis Mother   . Obesity Mother   . Arthritis Father     Objective: Office vital signs reviewed. BP (!) 157/91   Pulse 63   Temp (!) 97.5 F (36.4 C) (Temporal)   Ht 5\' 7"  (1.702 m)   Wt 160 lb (72.6 kg)   SpO2 97%   BMI 25.06 kg/m   Physical Examination:  General: Awake, alert, well nourished, No acute distress Cardio: Regular rate Pulmonary: Normal work of breathing on room air. MSK: Normal gait and station.  Assessment/ Plan: 74 y.o. female   1. Spinal stenosis, lumbar region with neurogenic claudication Usually well controlled with epidural shots.  However, uncertain if Medicare will continue to pay for these with recent changes.  She had tramadol as needed on hand from a previous provider.  She is using this medication extremely sparingly and therefore I will assume prescribing this for her.  Last refill was from February 2019.  The national narcotic database was reviewed and there were no red flags.  Controlled substance contract reviewed and signed.  Urine drug screen was obtained per office policy.  She will follow-up every 3 to 6 months as needed refills.  Though anticipate that she will likely not need to be seen this often as her use is scant. - DRUG SCREEN-TOXASSURE - traMADol (ULTRAM) 50 MG tablet; Take 1 tablet (50 mg total) by mouth every 12 (twelve) hours as needed for moderate pain or severe pain.  Dispense: 60 tablet; Refill: 1  2. Controlled substance agreement signed - DRUG SCREEN-TOXASSURE   Orders Placed This Encounter  Procedures  . DRUG SCREEN-TOXASSURE   No orders of the defined types were placed in this encounter.    March 2019, DO [facility] [phone]

## 2019-02-24 LAB — TOXASSURE SELECT 13 (MW), URINE

## 2019-03-24 DIAGNOSIS — H0288B Meibomian gland dysfunction left eye, upper and lower eyelids: Secondary | ICD-10-CM | POA: Diagnosis not present

## 2019-03-24 DIAGNOSIS — Z961 Presence of intraocular lens: Secondary | ICD-10-CM | POA: Diagnosis not present

## 2019-03-24 DIAGNOSIS — G245 Blepharospasm: Secondary | ICD-10-CM | POA: Diagnosis not present

## 2019-03-24 DIAGNOSIS — Z9842 Cataract extraction status, left eye: Secondary | ICD-10-CM | POA: Diagnosis not present

## 2019-03-24 DIAGNOSIS — H0288A Meibomian gland dysfunction right eye, upper and lower eyelids: Secondary | ICD-10-CM | POA: Diagnosis not present

## 2019-03-24 DIAGNOSIS — Z9841 Cataract extraction status, right eye: Secondary | ICD-10-CM | POA: Diagnosis not present

## 2019-03-31 ENCOUNTER — Other Ambulatory Visit: Payer: Self-pay | Admitting: Family Medicine

## 2019-03-31 ENCOUNTER — Telehealth: Payer: Self-pay | Admitting: Family Medicine

## 2019-03-31 NOTE — Telephone Encounter (Signed)
Attempted to contact patient.  NVM set up

## 2019-04-10 ENCOUNTER — Other Ambulatory Visit: Payer: Self-pay | Admitting: Family Medicine

## 2019-04-11 ENCOUNTER — Telehealth: Payer: Self-pay | Admitting: Family Medicine

## 2019-04-11 NOTE — Telephone Encounter (Signed)
Please make sure to fax order for caudal steroid epidural with Dr Mosetta Putt

## 2019-04-11 NOTE — Telephone Encounter (Signed)
Review and advise. 

## 2019-04-13 ENCOUNTER — Other Ambulatory Visit: Payer: Self-pay | Admitting: Family Medicine

## 2019-04-14 NOTE — Telephone Encounter (Signed)
Patient aware and verbalized understanding. °

## 2019-04-14 NOTE — Telephone Encounter (Signed)
Patient aware order has already been faxed.

## 2019-04-18 ENCOUNTER — Telehealth: Payer: Self-pay | Admitting: Family Medicine

## 2019-04-19 NOTE — Telephone Encounter (Signed)
This has been sent twice now.  Vanessa Pitts, can you look into this?

## 2019-04-20 ENCOUNTER — Other Ambulatory Visit: Payer: Self-pay | Admitting: Family

## 2019-04-20 DIAGNOSIS — M4306 Spondylolysis, lumbar region: Secondary | ICD-10-CM

## 2019-04-20 NOTE — Telephone Encounter (Signed)
Resent again, received confirmation again

## 2019-04-28 ENCOUNTER — Telehealth: Payer: Self-pay | Admitting: Family Medicine

## 2019-04-28 NOTE — Chronic Care Management (AMB) (Signed)
  Chronic Care Management   Outreach Note  04/28/2019 Name: Vanessa Pitts MRN: [id number] DOB: 05/11/1945  Vanessa Pitts is a 74 y.o. year old female who is a primary care patient of [name], DO. I reached out to [employer] by phone today in response to a referral sent by Ms. Darel Hong Kindig's health plan.     An unsuccessful telephone outreach was attempted today. The patient was referred to the case management team for assistance with care management and care coordination.   Follow Up Plan: The care management team will reach out to the patient again over the next 7 days. If patient returns call to provider office, please advise to call Embedded Care Management Care Guide Gwenevere Ghazi at [phone].  Gwenevere Ghazi  Care Guide, Embedded Care Coordination [facility]  [place], [place] [address] Direct Dial: [phone] [email or web address] Website: [email or web address]

## 2019-05-01 ENCOUNTER — Ambulatory Visit
Admission: RE | Admit: 2019-05-01 | Discharge: 2019-05-01 | Disposition: A | Discharge status: Home / Self Care | Payer: Medicare Other | Source: Ambulatory Visit | Attending: Family | Admitting: Family

## 2019-05-01 ENCOUNTER — Other Ambulatory Visit: Payer: Self-pay | Admitting: Family Medicine

## 2019-05-01 DIAGNOSIS — M545 Low back pain: Secondary | ICD-10-CM | POA: Diagnosis not present

## 2019-05-01 DIAGNOSIS — M4306 Spondylolysis, lumbar region: Secondary | ICD-10-CM

## 2019-05-01 DIAGNOSIS — J309 Allergic rhinitis, unspecified: Secondary | ICD-10-CM

## 2019-05-01 MED ORDER — METHYLPREDNISOLONE ACETATE 40 MG/ML INJ SUSP (RADIOLOG: 120.0000 mg | Freq: Once | INTRAMUSCULAR | Status: DC

## 2019-05-01 MED ORDER — IOPAMIDOL (ISOVUE-M 200) INJECTION 41%: 1.0000 mL | Freq: Once | INTRAMUSCULAR | Status: DC

## 2019-05-01 NOTE — Discharge Instructions (Signed)

## 2019-05-01 NOTE — Chronic Care Management (AMB) (Signed)
  Chronic Care Management   Outreach Note  05/01/2019 Name: Meliss Fleek MRN: [id number] DOB: Mar 26, 1945  Janele Lague is a 74 y.o. year old female who is a primary care patient of [name], DO. I reached out to [employer] by phone today in response to a referral sent by Ms. Darel Hong Krauter's health plan.     A second unsuccessful telephone outreach was attempted today. The patient was referred to the case management team for assistance with care management and care coordination.   Follow Up Plan: The care management team will reach out to the patient again over the next 7 days. If patient returns call to provider office, please advise to call Embedded Care Management Care Guide Gwenevere Ghazi at [phone].  Gwenevere Ghazi  Care Guide, Embedded Care Coordination [facility]  [place], [place] [address] Direct Dial: [phone] [email or web address] Website: [email or web address]

## 2019-05-03 NOTE — Chronic Care Management (AMB) (Signed)
  Chronic Care Management   Note  05/03/2019 Name: [name] MRN: [id number] DOB: 04/11/1945  [name] is a 74 y.o. year old female who is a primary care patient of Janora Norlander, DO. I reached out to [employer] by phone today in response to a referral sent by Ms. Bethena Roys Defalco's health plan.     Ms. Clift was given information about Chronic Care Management services today including:  1. CCM service includes personalized support from designated clinical staff supervised by her physician, including individualized plan of care and coordination with other care providers 2. 24/7 contact phone numbers for assistance for urgent and routine care needs. 3. Service will only be billed when office clinical staff spend 20 minutes or more in a month to coordinate care. 4. Only one practitioner may furnish and bill the service in a calendar month. 5. The patient may stop CCM services at any time (effective at the end of the month) by phone call to the office staff. 6. The patient will be responsible for cost sharing (co-pay) of up to 20% of the service fee (after annual deductible is met).  Patient did not agree to enrollment in care management services and does not wish to consider at this time.  Follow up plan: The patient has been provided with contact information for the care management team and has been advised to call with any health related questions or concerns.   [place], [place] [address] Direct Dial: [phone] [email or web address] Website: [email or web address]

## 2019-05-19 ENCOUNTER — Other Ambulatory Visit: Payer: Self-pay | Admitting: Family Medicine

## 2019-05-21 ENCOUNTER — Other Ambulatory Visit: Payer: Self-pay | Admitting: Family Medicine

## 2019-05-22 ENCOUNTER — Telehealth: Payer: Self-pay | Admitting: Family Medicine

## 2019-05-22 ENCOUNTER — Ambulatory Visit: Payer: Medicare Other | Admitting: Family Medicine

## 2019-05-22 NOTE — Telephone Encounter (Signed)
Had appointment scheduled for 3 month follow up today but needs to reschedule.  New appointment scheduled for 06/07/2019, patient aware.

## 2019-05-29 ENCOUNTER — Encounter: Payer: Self-pay | Admitting: Family

## 2019-05-29 ENCOUNTER — Ambulatory Visit (INDEPENDENT_AMBULATORY_CARE_PROVIDER_SITE_OTHER): Payer: Medicare Other | Admitting: Family

## 2019-05-29 ENCOUNTER — Other Ambulatory Visit: Payer: Self-pay

## 2019-05-29 VITALS — BP 158/93 | HR 76 | Temp 97.4°F | Ht 67.0 in | Wt 164.4 lb

## 2019-05-29 DIAGNOSIS — R300 Dysuria: Secondary | ICD-10-CM

## 2019-05-29 DIAGNOSIS — B3731 Acute candidiasis of vulva and vagina: Secondary | ICD-10-CM

## 2019-05-29 DIAGNOSIS — N3001 Acute cystitis with hematuria: Secondary | ICD-10-CM

## 2019-05-29 DIAGNOSIS — B373 Candidiasis of vulva and vagina: Secondary | ICD-10-CM

## 2019-05-29 LAB — URINALYSIS, COMPLETE
Bilirubin, UA: NEGATIVE
Glucose, UA: NEGATIVE
Ketones, UA: NEGATIVE
Nitrite, UA: NEGATIVE
Protein,UA: NEGATIVE
Specific Gravity, UA: 1.015 (ref 1.005–1.030)
Urobilinogen, Ur: 0.2 mg/dL (ref 0.2–1.0)
pH, UA: 6 (ref 5.0–7.5)

## 2019-05-29 LAB — MICROSCOPIC EXAMINATION
Renal Epithel, UA: NONE SEEN /hpf
WBC, UA: >30 /hpf — AB (ref 0–5)

## 2019-05-29 MED ORDER — FLUCONAZOLE 150 MG PO TABS: 150.0000 mg | ORAL_TABLET | ORAL | 0 refills | Status: DC | PRN

## 2019-05-29 MED ORDER — CEPHALEXIN 500 MG PO CAPS: 500.0000 mg | ORAL_CAPSULE | Freq: Two times a day (BID) | ORAL | 0 refills | Status: DC

## 2019-05-29 NOTE — Patient Instructions (Signed)

## 2019-05-29 NOTE — Progress Notes (Signed)
Subjective:    Patient ID: Vanessa Pitts, female    DOB: 1945-05-26, 74 y.o.   MRN: [id number]  Chief Complaint  Patient presents with  . Urinary Frequency    BEEN GOING ON FOR 3 WEEKS   . Dysuria    Urinary Frequency  This is a new problem. The current episode started 1 to 4 weeks ago. The problem occurs every urination. The problem has been gradually worsening. The quality of the pain is described as burning. The pain is mild. Associated symptoms include frequency, hesitancy and urgency. Pertinent negatives include no discharge, flank pain, hematuria, nausea or vomiting. She has tried increased fluids for the symptoms. The treatment provided mild relief.  Dysuria  Associated symptoms include frequency, hesitancy and urgency. Pertinent negatives include no discharge, flank pain, hematuria, nausea or vomiting.      Review of Systems  Gastrointestinal: Negative for nausea and vomiting.  Genitourinary: Positive for dysuria, frequency, hesitancy and urgency. Negative for flank pain and hematuria.  All other systems reviewed and are negative.      Objective:   Physical Exam Vitals reviewed.  Constitutional:      General: She is not in acute distress.    Appearance: She is well-developed.  HENT:     Head: Normocephalic and atraumatic.     Right Ear: Tympanic membrane normal.     Left Ear: Tympanic membrane normal.  Eyes:     Pupils: Pupils are equal, round, and reactive to light.  Neck:     Thyroid: No thyromegaly.  Cardiovascular:     Rate and Rhythm: Normal rate and regular rhythm.     Heart sounds: Normal heart sounds. No murmur.  Pulmonary:     Effort: Pulmonary effort is normal. No respiratory distress.     Breath sounds: Normal breath sounds. No wheezing.  Abdominal:     General: Bowel sounds are normal. There is no distension.     Palpations: Abdomen is soft.     Tenderness: There is no abdominal tenderness.  Musculoskeletal:        General: No tenderness. Normal  range of motion.     Cervical back: Normal range of motion and neck supple.  Skin:    General: Skin is warm and dry.  Neurological:     Mental Status: She is alert and oriented to person, place, and time.     Cranial Nerves: No cranial nerve deficit.     Deep Tendon Reflexes: Reflexes are normal and symmetric.  Psychiatric:        Behavior: Behavior normal.        Thought Content: Thought content normal.        Judgment: Judgment normal.     BP (!) 172/87   Pulse 76   Temp (!) 97.4 F (36.3 C) (Temporal)   Ht 5\' 7"  (1.702 m)   Wt 164 lb 6.4 oz (74.6 kg)   SpO2 99%   BMI 25.75 kg/m        Assessment & Plan:  Jillene Wehrenberg comes in today with chief complaint of Urinary Frequency (BEEN GOING ON FOR 3 WEEKS ) and Dysuria   Diagnosis and orders addressed:  1. Dysuria - Urinalysis, Complete - Urine Culture  2. Acute cystitis with hematuria Force fluids RTO prn Culture pending - cephALEXin (KEFLEX) 500 MG capsule; Take 1 capsule (500 mg total) by mouth 2 (two) times daily.  Dispense: 14 capsule; Refill: 0  3. Vagina, candidiasis Keep clean and dry - fluconazole (DIFLUCAN)  150 MG tablet; Take 1 tablet (150 mg total) by mouth every three (3) days as needed.  Dispense: 3 tablet; Refill: 0   Jannifer Rodney, FNP

## 2019-05-31 LAB — URINE CULTURE

## 2019-06-07 ENCOUNTER — Ambulatory Visit (INDEPENDENT_AMBULATORY_CARE_PROVIDER_SITE_OTHER): Payer: Medicare Other | Admitting: Family Medicine

## 2019-06-07 ENCOUNTER — Encounter: Payer: Self-pay | Admitting: Family Medicine

## 2019-06-07 ENCOUNTER — Other Ambulatory Visit: Payer: Self-pay

## 2019-06-07 VITALS — BP 140/79 | HR 78 | Temp 97.7°F | Ht 67.0 in | Wt 160.0 lb

## 2019-06-07 DIAGNOSIS — I10 Essential (primary) hypertension: Secondary | ICD-10-CM | POA: Diagnosis not present

## 2019-06-07 DIAGNOSIS — Z23 Encounter for immunization: Secondary | ICD-10-CM

## 2019-06-07 DIAGNOSIS — M48062 Spinal stenosis, lumbar region with neurogenic claudication: Secondary | ICD-10-CM | POA: Diagnosis not present

## 2019-06-07 DIAGNOSIS — N1832 Chronic kidney disease, stage 3b: Secondary | ICD-10-CM | POA: Diagnosis not present

## 2019-06-07 MED ORDER — TRAMADOL HCL 50 MG PO TABS: 50.0000 mg | ORAL_TABLET | Freq: Two times a day (BID) | ORAL | 1 refills | Status: DC | PRN

## 2019-06-07 NOTE — Progress Notes (Signed)
Subjective: CC: Chronic pain, HTN, CKD3 PCP: Janora Norlander, DO LHT:DSKA Vanessa Pitts is a 74 y.o. female presenting to clinic today for:  1.  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    Low back pain radiates the left lower leg.  She had her injection last month and is not due for another couple of months.  She continues to use tramadol very rarely and maybe only once per week.  She still has some on hand but would like me to go ahead and send over a prescription to put on file if she needs it.  Denies any falls, excessive daytime sedation, constipation.  2. HTN w/ CKD3 Reports compliance with her Micardis, Pravachol.  No chest pain, shortness of breath, lower extremity edema.   ROS: Per HPI  No Known Allergies Past Medical History:  Diagnosis Date  . Chronically dry eyes   . Hyperlipidemia   . Hypertension   . Leg pain, left   . Osteoporosis   . Ovarian cyst     Current Outpatient Medications:  .  alendronate (FOSAMAX) 70 MG tablet, TAKE 1 TABLET BY MOUTH ONCE A WEEK WITH A FULL GLASS OF WATER ON AN EMPTY STOMACH, Disp: 12 tablet, Rfl: 0 .  doxycycline (MONODOX) 50 MG capsule, Take 50 mg by mouth daily., Disp: , Rfl:  .  EQ ALLERGY RELIEF, CETIRIZINE, 10 MG tablet, Take 1/2 (one-half) tablet by mouth once daily, Disp: 45 tablet, Rfl: 0 .  fluconazole (DIFLUCAN) 150 MG tablet, Take 1 tablet (150 mg total) by mouth every three (3) days as needed., Disp: 3 tablet, Rfl: 0 .  fluticasone (FLONASE) 50 MCG/ACT nasal spray, Place 1 spray into both nostrils 2 (two) times daily as needed for allergies or rhinitis., Disp: 16 g, Rfl: 6 .  pantoprazole (PROTONIX) 20 MG tablet, Take 1 tablet by mouth once daily, Disp: 90 tablet, Rfl: 0 .  pravastatin (PRAVACHOL) 20 MG tablet, Take 1 tablet by mouth once daily, Disp: 90 tablet, Rfl: 0 .  telmisartan (MICARDIS) 20 MG tablet,  Take 1 tablet by mouth once daily, Disp: 90 tablet, Rfl: 0 .  traMADol (ULTRAM) 50 MG tablet, Take 1 tablet (50 mg total) by mouth every 12 (twelve) hours as needed for moderate pain or severe pain., Disp: 60 tablet, Rfl: 1 .  vitamin B-12 (CYANOCOBALAMIN) 500 MCG tablet, 500 mcg., Disp: , Rfl:  Social History   Socioeconomic History  . Marital status: Married    Spouse name: [name]  . Number of children: 3  . Years of education: 62  . Highest education level: Some college, no degree  Occupational History  . Occupation: retired  Tobacco Use  . Smoking status: Never Smoker  . Smokeless tobacco: Never Used  Substance and Sexual Activity  . Alcohol use: No  . Drug use: No  . Sexual activity: Yes    Birth control/protection: Post-menopausal  Other Topics Concern  . Not on file  Social History Narrative  . Not on file   Social Determinants of Health   Financial Resource Strain: Low Risk   . Difficulty of Paying Living Expenses: Not hard at all  Food Insecurity: No Food Insecurity  . Worried About Charity fundraiser in the Last Year: Never true  . Ran Out of Food in the Last Year: Never true  Transportation Needs: No Transportation Needs  . Lack of Transportation (Medical): No  .  Lack of Transportation (Non-Medical): No  Physical Activity: Insufficiently Active  . Days of Exercise per Week: 7 days  . Minutes of Exercise per Session: 20 min  Stress: No Stress Concern Present  . Feeling of Stress : Not at all  Social Connections: Somewhat Isolated  . Frequency of Communication with Friends and Family: More than three times a week  . Frequency of Social Gatherings with Friends and Family: More than three times a week  . Attends Religious Services: Never  . Active Member of Clubs or Organizations: No  . Attends Archivist Meetings: Never  . Marital Status: Married  Human resources officer Violence: Not At Risk  . Fear of Current or Ex-Partner: No  . Emotionally Abused: No    . Physically Abused: No  . Sexually Abused: No   Family History  Problem Relation Age of Onset  . Heart disease Mother   . Diabetes Mother   . Hyperlipidemia Mother   . Hypertension Mother   . Arthritis Mother   . Obesity Mother   . Arthritis Father     Objective: Office vital signs reviewed. BP 140/79   Pulse 78   Temp 97.7 F (36.5 C) (Temporal)   Ht '5\' 7"'  (1.702 m)   Wt 160 lb (72.6 kg)   SpO2 98%   BMI 25.06 kg/m   Physical Examination:  General: Awake, alert, well nourished, No acute distress Cardio: Regular rate and rhythm.  S1-S2 heard.  No murmurs. Pulmonary: Clear to auscultation bilaterally.  No wheezes, rhonchi or rales.  Normal work of breathing on room air. MSK: Normal gait and station.  Ambulating independently. Psych: Mood stable, speech normal, affect appropriate, pleasant and interactive Neuro: Difficult of hearing.  Requires hearing aids  Assessment/ Plan: 74 y.o. female   1. Essential hypertension Controlled.  Continue current regimen - CMP14+EGFR - Lipid Panel  2. Stage 3b chronic kidney disease Check renal function - CMP14+EGFR - CBC  3. Spinal stenosis, lumbar region with neurogenic claudication Has not even gotten the refill of the previous Rx but have gone ahead and sent in a new 1 to place on file in case that when expires before her next refill.  She is using this extremely appropriately and very rarely.  No concerning side effects.  I asked her to contact me about 2 weeks prior to her next injection so that we can get that faxed over to the appropriate place.  This has been delayed in the past due to communication issues between the offices.  The national narcotic database was reviewed and there were no red flags. - CBC - traMADol (ULTRAM) 50 MG tablet; Take 1 tablet (50 mg total) by mouth every 12 (twelve) hours as needed for moderate pain or severe pain.  Dispense: 60 tablet; Refill: 1   No orders of the defined types were placed in  this encounter.  No orders of the defined types were placed in this encounter.    Janora Norlander, DO [place] [phone]

## 2019-06-08 LAB — CMP14+EGFR
ALT: 12 [IU]/L (ref 0–32)
AST: 20 [IU]/L (ref 0–40)
Albumin/Globulin Ratio: 2.3 — ABNORMAL HIGH (ref 1.2–2.2)
Albumin: 4.6 g/dL (ref 3.7–4.7)
Alkaline Phosphatase: 53 [IU]/L (ref 39–117)
BUN/Creatinine Ratio: 16 (ref 12–28)
BUN: 16 mg/dL (ref 8–27)
Bilirubin Total: 0.4 mg/dL (ref 0.0–1.2)
CO2: 23 mmol/L (ref 20–29)
Calcium: 9.6 mg/dL (ref 8.7–10.3)
Chloride: 98 mmol/L (ref 96–106)
Creatinine, Ser: 1.03 mg/dL — ABNORMAL HIGH (ref 0.57–1.00)
GFR calc Af Amer: 62 mL/min/{1.73_m2}
GFR calc non Af Amer: 54 mL/min/{1.73_m2} — ABNORMAL LOW
Globulin, Total: 2 g/dL (ref 1.5–4.5)
Glucose: 66 mg/dL (ref 65–99)
Potassium: 4.4 mmol/L (ref 3.5–5.2)
Sodium: 135 mmol/L (ref 134–144)
Total Protein: 6.6 g/dL (ref 6.0–8.5)

## 2019-06-08 LAB — LIPID PANEL
Chol/HDL Ratio: 2.5 ratio (ref 0.0–4.4)
Cholesterol, Total: 147 mg/dL (ref 100–199)
HDL: 60 mg/dL
LDL Chol Calc (NIH): 74 mg/dL (ref 0–99)
Triglycerides: 65 mg/dL (ref 0–149)
VLDL Cholesterol Cal: 13 mg/dL (ref 5–40)

## 2019-06-08 LAB — CBC
Hematocrit: 42 % (ref 34.0–46.6)
Hemoglobin: 14.1 g/dL (ref 11.1–15.9)
MCH: 29.4 pg (ref 26.6–33.0)
MCHC: 33.6 g/dL (ref 31.5–35.7)
MCV: 88 fL (ref 79–97)
Platelets: 285 10*3/uL (ref 150–450)
RBC: 4.8 x10E6/uL (ref 3.77–5.28)
RDW: 13.1 % (ref 11.7–15.4)
WBC: 4.3 10*3/uL (ref 3.4–10.8)

## 2019-07-07 DIAGNOSIS — H0288B Meibomian gland dysfunction left eye, upper and lower eyelids: Secondary | ICD-10-CM | POA: Diagnosis not present

## 2019-07-07 DIAGNOSIS — H0288A Meibomian gland dysfunction right eye, upper and lower eyelids: Secondary | ICD-10-CM | POA: Diagnosis not present

## 2019-07-07 DIAGNOSIS — Z961 Presence of intraocular lens: Secondary | ICD-10-CM | POA: Diagnosis not present

## 2019-07-07 DIAGNOSIS — Z9842 Cataract extraction status, left eye: Secondary | ICD-10-CM | POA: Diagnosis not present

## 2019-07-07 DIAGNOSIS — Z9841 Cataract extraction status, right eye: Secondary | ICD-10-CM | POA: Diagnosis not present

## 2019-07-07 DIAGNOSIS — G245 Blepharospasm: Secondary | ICD-10-CM | POA: Diagnosis not present

## 2019-07-07 DIAGNOSIS — H1013 Acute atopic conjunctivitis, bilateral: Secondary | ICD-10-CM | POA: Diagnosis not present

## 2019-07-14 ENCOUNTER — Other Ambulatory Visit: Payer: Self-pay | Admitting: Family Medicine

## 2019-07-26 ENCOUNTER — Other Ambulatory Visit: Payer: Self-pay | Admitting: *Deleted

## 2019-07-26 MED ORDER — ALENDRONATE SODIUM 70 MG PO TABS: ORAL_TABLET | ORAL | 0 refills | Status: DC

## 2019-07-27 ENCOUNTER — Other Ambulatory Visit: Payer: Self-pay | Admitting: Family Medicine

## 2019-07-28 ENCOUNTER — Telehealth: Payer: Self-pay | Admitting: Family Medicine

## 2019-07-28 ENCOUNTER — Other Ambulatory Visit: Payer: Self-pay | Admitting: Family Medicine

## 2019-07-28 DIAGNOSIS — M4307 Spondylolysis, lumbosacral region: Secondary | ICD-10-CM

## 2019-07-28 NOTE — Telephone Encounter (Signed)
Will give to [facility] to fax.

## 2019-07-28 NOTE — Telephone Encounter (Signed)
Patient aware.

## 2019-08-03 ENCOUNTER — Ambulatory Visit
Admission: RE | Admit: 2019-08-03 | Discharge: 2019-08-03 | Disposition: A | Discharge status: Home / Self Care | Payer: Medicare Other | Source: Ambulatory Visit | Attending: Family Medicine | Admitting: Family Medicine

## 2019-08-03 ENCOUNTER — Other Ambulatory Visit: Payer: Self-pay

## 2019-08-03 DIAGNOSIS — M4307 Spondylolysis, lumbosacral region: Secondary | ICD-10-CM

## 2019-08-03 MED ORDER — IOPAMIDOL (ISOVUE-M 200) INJECTION 41%
1.0000 mL | Freq: Once | INTRAMUSCULAR | Status: AC
  Administered 2019-08-03: 1 mL via EPIDURAL

## 2019-08-03 MED ORDER — METHYLPREDNISOLONE ACETATE 40 MG/ML INJ SUSP (RADIOLOG
120.0000 mg | Freq: Once | INTRAMUSCULAR | Status: AC
  Administered 2019-08-03: 120 mg via EPIDURAL

## 2019-08-03 NOTE — Discharge Instructions (Signed)

## 2019-08-10 ENCOUNTER — Other Ambulatory Visit: Payer: Self-pay | Admitting: Family Medicine

## 2019-08-21 ENCOUNTER — Other Ambulatory Visit: Payer: Self-pay | Admitting: Family Medicine

## 2019-09-06 ENCOUNTER — Encounter: Payer: Self-pay | Admitting: Family Medicine

## 2019-09-06 ENCOUNTER — Other Ambulatory Visit: Payer: Self-pay

## 2019-09-06 ENCOUNTER — Ambulatory Visit (INDEPENDENT_AMBULATORY_CARE_PROVIDER_SITE_OTHER): Payer: Medicare Other | Admitting: Family Medicine

## 2019-09-06 VITALS — BP 150/90 | HR 79 | Temp 97.4°F | Ht 67.0 in | Wt 159.0 lb

## 2019-09-06 DIAGNOSIS — H60543 Acute eczematoid otitis externa, bilateral: Secondary | ICD-10-CM | POA: Diagnosis not present

## 2019-09-06 DIAGNOSIS — M810 Age-related osteoporosis without current pathological fracture: Secondary | ICD-10-CM

## 2019-09-06 DIAGNOSIS — Z1211 Encounter for screening for malignant neoplasm of colon: Secondary | ICD-10-CM | POA: Diagnosis not present

## 2019-09-06 DIAGNOSIS — N1832 Chronic kidney disease, stage 3b: Secondary | ICD-10-CM | POA: Diagnosis not present

## 2019-09-06 DIAGNOSIS — M48062 Spinal stenosis, lumbar region with neurogenic claudication: Secondary | ICD-10-CM | POA: Diagnosis not present

## 2019-09-06 DIAGNOSIS — I10 Essential (primary) hypertension: Secondary | ICD-10-CM

## 2019-09-06 MED ORDER — TRIAMCINOLONE ACETONIDE 0.1 % EX CREA: TOPICAL_CREAM | CUTANEOUS | 0 refills | Status: DC

## 2019-09-06 NOTE — Progress Notes (Signed)
Subjective: CC: Chronic pain, HTN, CKD3 PCP: [name], DO IWL:NLGX Vanessa Pitts is a 74 y.o. female presenting to clinic today for:  1.  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    Low back pain typically radiates the left lower leg.   She reports that the last injection she had last month actually is doing really well and her pain is extremely well controlled.  She is had no need for the tramadol whatsoever but still has plenty of pills left over.  2. HTN w/ CKD3; osteoporosis Reports compliance with her Micardis, Pravachol.  No chest pain, shortness of breath, lower extremity edema.  She continues to take Fosamax for her bones.  3.  Ear itching Patient has persistent ear itching despite use of sweet oil.  She continues to use hearing aids bilaterally.  Denies any drainage.  No pain.    ROS: Per HPI  No Known Allergies Past Medical History:  Diagnosis Date   Chronically dry eyes    Hyperlipidemia    Hypertension    Leg pain, left    Osteoporosis    Ovarian cyst     Current Outpatient Medications:    alendronate (FOSAMAX) 70 MG tablet, TAKE 1 TABLET BY MOUTH ONCE A WEEK WITH A FULL GLASS OF WATER ON AN EMPTY STOMACH, Disp: 12 tablet, Rfl: 0   doxycycline (MONODOX) 50 MG capsule, Take 50 mg by mouth daily., Disp: , Rfl:    EQ ALLERGY RELIEF, CETIRIZINE, 10 MG tablet, Take 1/2 (one-half) tablet by mouth once daily, Disp: 45 tablet, Rfl: 0   fluconazole (DIFLUCAN) 150 MG tablet, Take 1 tablet (150 mg total) by mouth every three (3) days as needed., Disp: 3 tablet, Rfl: 0   fluticasone (FLONASE) 50 MCG/ACT nasal spray, Place 1 spray into both nostrils 2 (two) times daily as needed for allergies or rhinitis., Disp: 16 g, Rfl: 6   pantoprazole (PROTONIX) 20 MG tablet, Take 1 tablet by mouth once daily, Disp: 90 tablet, Rfl: 0   pravastatin  (PRAVACHOL) 20 MG tablet, Take 1 tablet by mouth once daily, Disp: 90 tablet, Rfl: 0   telmisartan (MICARDIS) 20 MG tablet, Take 1 tablet by mouth once daily, Disp: 90 tablet, Rfl: 0   traMADol (ULTRAM) 50 MG tablet, Take 1 tablet (50 mg total) by mouth every 12 (twelve) hours as needed for moderate pain or severe pain., Disp: 60 tablet, Rfl: 1   vitamin B-12 (CYANOCOBALAMIN) 500 MCG tablet, 500 mcg., Disp: , Rfl:  Social History   Socioeconomic History   Marital status: Married    Spouse name: Shirlee Limerick   Number of children: 3   Years of education: 13   Highest education level: Some college, no degree  Occupational History   Occupation: retired  Tobacco Use   Smoking status: Never Smoker   Smokeless tobacco: Never Used  Substance and Sexual Activity   Alcohol use: No   Drug use: No   Sexual activity: Yes    Birth control/protection: Post-menopausal  Other Topics Concern   Not on file  Social History Narrative   Not on file   Social Determinants of Health   Financial Resource Strain: Low Risk    Difficulty of Paying Living Expenses: Not hard at all  Food Insecurity: No Food Insecurity   Worried About Programme researcher, broadcasting/film/video in the Last Year: Never true   [employer]  in the Last Year: Never true  Transportation Needs: No Transportation Needs   Lack of Transportation (Medical): No   Lack of Transportation (Non-Medical): No  Physical Activity: Insufficiently Active   Days of Exercise per Week: 7 days   Minutes of Exercise per Session: 20 min  Stress: No Stress Concern Present   Feeling of Stress : Not at all  Social Connections: Moderately Isolated   Frequency of Communication with Friends and Family: More than three times a week   Frequency of Social Gatherings with Friends and Family: More than three times a week   Attends Religious Services: Never   Database administrator or Organizations: No   Attends Engineer, structural: Never    Marital Status: Married  Catering manager Violence: Not At Risk   Fear of Current or Ex-Partner: No   Emotionally Abused: No   Physically Abused: No   Sexually Abused: No   Family History  Problem Relation Age of Onset   Heart disease Mother    Diabetes Mother    Hyperlipidemia Mother    Hypertension Mother    Arthritis Mother    Obesity Mother    Arthritis Father     Objective: Office vital signs reviewed. BP (!) 150/90    Pulse 79    Temp (!) 97.4 F (36.3 C) (Temporal)    Ht 5\' 7"  (1.702 m)    Wt 159 lb (72.1 kg)    SpO2 98%    BMI 24.90 kg/m   Physical Examination:  General: Awake, alert, well nourished, No acute distress HEENT: She has a hemostatic abrasion noted along the posterior external auditory canal on the left.  TMs appear healthy and intact.  Scant cerumen surrounding.  Right TM healthy and intact.  There is scant cerumen in the auditory canal Cardio: Regular rate and rhythm.  S1-S2 heard.  No murmurs. Pulmonary: Clear to auscultation bilaterally.  No wheezes, rhonchi or rales.  Normal work of breathing on room air. MSK: Normal gait and station.  Ambulating independently. Psych: Mood stable, speech normal, affect appropriate, pleasant and interactive  Assessment/ Plan: 74 y.o. female   1. Dermatitis of both ear canals Trial of triamcinolone applied sparingly to the affected areas.  2. Essential hypertension Controlled but borderline.  Continue current regimen.  May need to consider advancing Micardis to 40 mg daily  3. Stage 3b chronic kidney disease Continue ACE inhibitor.  Check BMP - Basic Metabolic Panel  4. Spinal stenosis, lumbar region with neurogenic claudication Not yet needing injection  5. Age-related osteoporosis without current pathological fracture Check vitamin D - Basic Metabolic Panel - VITAMIN D 25 Hydroxy (Vit-D Deficiency, Fractures)  6. Colon cancer screening Cologuard sent.  No apparent contraindications.  Patient  appears average risk - Cologuard   No orders of the defined types were placed in this encounter.  No orders of the defined types were placed in this encounter.    65, DO [facility] [phone]

## 2019-09-06 NOTE — Patient Instructions (Signed)
Cologuard will come to the house.  Send it back as soon as possible  You had labs performed today.  You will be contacted with the results of the labs once they are available, usually in the next 3 business days for routine lab work.  If you have an active my chart account, they will be released to your MyChart.  If you prefer to have these labs released to you via telephone, please let us know.  If you had a pap smear or biopsy performed, expect to be contacted in about 7-10 days.

## 2019-09-14 DIAGNOSIS — Z1211 Encounter for screening for malignant neoplasm of colon: Secondary | ICD-10-CM | POA: Diagnosis not present

## 2019-09-14 DIAGNOSIS — Z1212 Encounter for screening for malignant neoplasm of rectum: Secondary | ICD-10-CM | POA: Diagnosis not present

## 2019-09-19 ENCOUNTER — Other Ambulatory Visit: Payer: Self-pay | Admitting: Family Medicine

## 2019-09-21 LAB — EXTERNAL GENERIC LAB PROCEDURE: COLOGUARD: NEGATIVE

## 2019-09-21 LAB — COLOGUARD: COLOGUARD: NEGATIVE

## 2019-09-25 LAB — COLOGUARD: Cologuard: NEGATIVE

## 2019-10-11 ENCOUNTER — Other Ambulatory Visit: Payer: Self-pay | Admitting: Family Medicine

## 2019-11-15 ENCOUNTER — Telehealth: Payer: Self-pay

## 2019-11-15 DIAGNOSIS — R112 Nausea with vomiting, unspecified: Secondary | ICD-10-CM | POA: Diagnosis not present

## 2019-11-15 DIAGNOSIS — K8020 Calculus of gallbladder without cholecystitis without obstruction: Secondary | ICD-10-CM | POA: Diagnosis not present

## 2019-11-15 DIAGNOSIS — K449 Diaphragmatic hernia without obstruction or gangrene: Secondary | ICD-10-CM | POA: Diagnosis not present

## 2019-11-15 DIAGNOSIS — K828 Other specified diseases of gallbladder: Secondary | ICD-10-CM | POA: Diagnosis not present

## 2019-11-15 DIAGNOSIS — K810 Acute cholecystitis: Secondary | ICD-10-CM | POA: Diagnosis not present

## 2019-11-15 DIAGNOSIS — Z7983 Long term (current) use of bisphosphonates: Secondary | ICD-10-CM | POA: Diagnosis not present

## 2019-11-15 DIAGNOSIS — I700 Atherosclerosis of aorta: Secondary | ICD-10-CM | POA: Diagnosis not present

## 2019-11-15 DIAGNOSIS — I10 Essential (primary) hypertension: Secondary | ICD-10-CM | POA: Diagnosis not present

## 2019-11-15 DIAGNOSIS — Z792 Long term (current) use of antibiotics: Secondary | ICD-10-CM | POA: Diagnosis not present

## 2019-11-15 DIAGNOSIS — R2689 Other abnormalities of gait and mobility: Secondary | ICD-10-CM | POA: Diagnosis not present

## 2019-11-15 DIAGNOSIS — Z79899 Other long term (current) drug therapy: Secondary | ICD-10-CM | POA: Diagnosis not present

## 2019-11-15 DIAGNOSIS — Z20822 Contact with and (suspected) exposure to covid-19: Secondary | ICD-10-CM | POA: Diagnosis not present

## 2019-11-15 NOTE — Telephone Encounter (Signed)
Done, given to Almira Coaster to fax to [place] [facility]

## 2019-11-16 ENCOUNTER — Other Ambulatory Visit: Payer: Self-pay | Admitting: Family Medicine

## 2019-11-16 DIAGNOSIS — K8020 Calculus of gallbladder without cholecystitis without obstruction: Secondary | ICD-10-CM | POA: Diagnosis not present

## 2019-11-16 DIAGNOSIS — K8012 Calculus of gallbladder with acute and chronic cholecystitis without obstruction: Secondary | ICD-10-CM | POA: Diagnosis not present

## 2019-11-16 DIAGNOSIS — I700 Atherosclerosis of aorta: Secondary | ICD-10-CM | POA: Diagnosis not present

## 2019-11-16 DIAGNOSIS — K8080 Other cholelithiasis without obstruction: Secondary | ICD-10-CM | POA: Diagnosis not present

## 2019-11-16 DIAGNOSIS — K810 Acute cholecystitis: Secondary | ICD-10-CM | POA: Diagnosis not present

## 2019-11-16 DIAGNOSIS — K915 Postcholecystectomy syndrome: Secondary | ICD-10-CM | POA: Diagnosis not present

## 2019-11-16 DIAGNOSIS — M47816 Spondylosis without myelopathy or radiculopathy, lumbar region: Secondary | ICD-10-CM

## 2019-11-16 DIAGNOSIS — K828 Other specified diseases of gallbladder: Secondary | ICD-10-CM | POA: Diagnosis not present

## 2019-11-16 DIAGNOSIS — K449 Diaphragmatic hernia without obstruction or gangrene: Secondary | ICD-10-CM | POA: Diagnosis not present

## 2019-11-16 DIAGNOSIS — R748 Abnormal levels of other serum enzymes: Secondary | ICD-10-CM | POA: Diagnosis not present

## 2019-11-17 DIAGNOSIS — K810 Acute cholecystitis: Secondary | ICD-10-CM | POA: Diagnosis not present

## 2019-11-17 DIAGNOSIS — I10 Essential (primary) hypertension: Secondary | ICD-10-CM | POA: Diagnosis not present

## 2019-11-17 DIAGNOSIS — E785 Hyperlipidemia, unspecified: Secondary | ICD-10-CM | POA: Diagnosis not present

## 2019-11-18 DIAGNOSIS — K8020 Calculus of gallbladder without cholecystitis without obstruction: Secondary | ICD-10-CM | POA: Diagnosis not present

## 2019-11-18 DIAGNOSIS — I10 Essential (primary) hypertension: Secondary | ICD-10-CM | POA: Diagnosis not present

## 2019-11-18 DIAGNOSIS — E785 Hyperlipidemia, unspecified: Secondary | ICD-10-CM | POA: Diagnosis not present

## 2019-11-19 DIAGNOSIS — K8020 Calculus of gallbladder without cholecystitis without obstruction: Secondary | ICD-10-CM | POA: Diagnosis not present

## 2019-11-23 DIAGNOSIS — Z9049 Acquired absence of other specified parts of digestive tract: Secondary | ICD-10-CM | POA: Insufficient documentation

## 2019-11-24 ENCOUNTER — Other Ambulatory Visit: Payer: Self-pay | Admitting: Family Medicine

## 2019-11-27 MED ORDER — TELMISARTAN 20 MG PO TABS: 20.0000 mg | ORAL_TABLET | Freq: Every day | ORAL | 0 refills | Status: DC

## 2019-11-27 NOTE — Addendum Note (Signed)
Addended by: Julious Payer D on: 11/27/2019 09:40 AM   Modules accepted: Orders

## 2019-11-27 NOTE — Telephone Encounter (Signed)
E-prescribe down. resent 

## 2019-12-01 DIAGNOSIS — H0288B Meibomian gland dysfunction left eye, upper and lower eyelids: Secondary | ICD-10-CM | POA: Diagnosis not present

## 2019-12-01 DIAGNOSIS — H0288A Meibomian gland dysfunction right eye, upper and lower eyelids: Secondary | ICD-10-CM | POA: Diagnosis not present

## 2019-12-01 DIAGNOSIS — G245 Blepharospasm: Secondary | ICD-10-CM | POA: Diagnosis not present

## 2019-12-01 DIAGNOSIS — Z961 Presence of intraocular lens: Secondary | ICD-10-CM | POA: Diagnosis not present

## 2019-12-10 ENCOUNTER — Other Ambulatory Visit: Payer: Self-pay | Admitting: Family Medicine

## 2019-12-18 ENCOUNTER — Ambulatory Visit
Admission: RE | Admit: 2019-12-18 | Discharge: 2019-12-18 | Disposition: A | Discharge status: Home / Self Care | Payer: Medicare Other | Source: Ambulatory Visit | Attending: Family Medicine | Admitting: Family Medicine

## 2019-12-18 ENCOUNTER — Other Ambulatory Visit: Payer: Self-pay

## 2019-12-18 DIAGNOSIS — M47817 Spondylosis without myelopathy or radiculopathy, lumbosacral region: Secondary | ICD-10-CM | POA: Diagnosis not present

## 2019-12-18 DIAGNOSIS — M47816 Spondylosis without myelopathy or radiculopathy, lumbar region: Secondary | ICD-10-CM

## 2019-12-18 MED ORDER — IOPAMIDOL (ISOVUE-M 200) INJECTION 41%
1.0000 mL | Freq: Once | INTRAMUSCULAR | Status: AC
  Administered 2019-12-18: 1 mL via EPIDURAL

## 2019-12-18 MED ORDER — METHYLPREDNISOLONE ACETATE 40 MG/ML INJ SUSP (RADIOLOG
120.0000 mg | Freq: Once | INTRAMUSCULAR | Status: AC
  Administered 2019-12-18: 120 mg via EPIDURAL

## 2019-12-18 NOTE — Discharge Instructions (Signed)
Spinal Injection Discharge Instruction Sheet  1. You may resume a regular diet and any medications that you routinely take, including pain medications.  2. No driving the rest of the day of the procedure.  3. Light activity throughout the rest of the day.  Do not do any strenuous work, exercise, bending or lifting.  The day following the procedure, you may resume normal physical activity but you should refrain from exercising or physical therapy for at least three days.   Common Side Effects:   Headaches- take your usual medications as directed by your physician.     Restlessness or inability to sleep- you may have trouble sleeping for the next few days.  Ask your referring physician if you need any medication for sleep if over the counter sleep medications do not help.   Facial flushing or redness- this should subside within a few days.   Increased pain- a temporary increase in pain a day or two following your procedure is not unusual.  Take your pain medication as prescribed by your referring physician.  You may use ice to the injection site as needed.  Please do not use heat for 24 hours.   Leg cramps  Please contact our office at [phone] for the following symptoms:  Fever greater than 100 degrees.  Headaches unresolved with medication after 2-3 days.  Increased swelling, pain, or redness at injection site.  Thank you for visiting our office.   You may resume Excedrin today.  

## 2020-01-09 DIAGNOSIS — R432 Parageusia: Secondary | ICD-10-CM | POA: Diagnosis not present

## 2020-01-09 DIAGNOSIS — R5383 Other fatigue: Secondary | ICD-10-CM | POA: Diagnosis not present

## 2020-01-09 DIAGNOSIS — R6883 Chills (without fever): Secondary | ICD-10-CM | POA: Diagnosis not present

## 2020-01-09 DIAGNOSIS — J029 Acute pharyngitis, unspecified: Secondary | ICD-10-CM | POA: Diagnosis not present

## 2020-01-09 DIAGNOSIS — Z20822 Contact with and (suspected) exposure to covid-19: Secondary | ICD-10-CM | POA: Diagnosis not present

## 2020-01-09 DIAGNOSIS — R509 Fever, unspecified: Secondary | ICD-10-CM | POA: Diagnosis not present

## 2020-01-15 ENCOUNTER — Ambulatory Visit (INDEPENDENT_AMBULATORY_CARE_PROVIDER_SITE_OTHER): Payer: Medicare Other | Admitting: Family Medicine

## 2020-01-15 DIAGNOSIS — N1832 Chronic kidney disease, stage 3b: Secondary | ICD-10-CM

## 2020-01-15 DIAGNOSIS — U071 COVID-19: Secondary | ICD-10-CM

## 2020-01-15 MED ORDER — PRAVASTATIN SODIUM 20 MG PO TABS
20.0000 mg | ORAL_TABLET | Freq: Every day | ORAL | 3 refills | Status: DC
Start: 2020-01-15 — End: 2021-02-20

## 2020-01-15 MED ORDER — ALENDRONATE SODIUM 70 MG PO TABS: ORAL_TABLET | ORAL | 3 refills | Status: DC

## 2020-01-15 MED ORDER — TELMISARTAN 20 MG PO TABS
20.0000 mg | ORAL_TABLET | Freq: Every day | ORAL | 3 refills | Status: DC
Start: 2020-01-15 — End: 2020-02-21

## 2020-01-15 NOTE — Progress Notes (Signed)
Telephone visit  Subjective: CC: COVID infection PCP: [name], DO WHQ:PRFF Vanessa Pitts is a 74 y.o. female calls for telephone consult today. Patient provides verbal consent for consult held via phone.  Due to COVID-19 pandemic this visit was conducted virtually. This visit type was conducted due to national recommendations for restrictions regarding the COVID-19 Pandemic (e.g. social distancing, sheltering in place) in an effort to limit this patient's exposure and mitigate transmission in our community. All issues noted in this document were discussed and addressed.  A physical exam was not performed with this format.   Location of patient: home Location of provider: [facility] Others present for call: none  1. COVID infection She reports onset last Wednesday.  She has had fatigue, generalized weakness, mild diarrhea and stuffiness.  No reports of fever, chest pain, shortness of breath or cough.  She was treated with steroids per her report.  Has not been offered monoclonal antibody treatment.  She is not vaccinated against COVID.  2.  CKD 3 Patient is compliant with her ARB.  No reports of chest pain.  She has had diarrhea as above.  She is hydrating without difficulty.  Needs refills on her pravastatin and Fosamax as well   ROS: Per HPI  No Known Allergies Past Medical History:  Diagnosis Date  . Chronically dry eyes   . Hyperlipidemia   . Hypertension   . Leg pain, left   . Osteoporosis   . Ovarian cyst     Current Outpatient Medications:  .  alendronate (FOSAMAX) 70 MG tablet, TAKE 1 TABLET BY MOUTH ONCE A WEEK WITH A FULL GLASS OF WATER ON AN EMPTY STOMACH, Disp: 12 tablet, Rfl: 0 .  doxycycline (MONODOX) 50 MG capsule, Take 50 mg by mouth daily., Disp: , Rfl:  .  EQ ALLERGY RELIEF, CETIRIZINE, 10 MG tablet, Take 1/2 (one-half) tablet by mouth once daily, Disp: 45 tablet, Rfl: 0 .  fluconazole (DIFLUCAN) 150 MG tablet, Take 1 tablet (150 mg total) by mouth every three (3)  days as needed., Disp: 3 tablet, Rfl: 0 .  fluticasone (FLONASE) 50 MCG/ACT nasal spray, Place 1 spray into both nostrils 2 (two) times daily as needed for allergies or rhinitis., Disp: 16 g, Rfl: 6 .  pantoprazole (PROTONIX) 20 MG tablet, Take 1 tablet by mouth once daily, Disp: 90 tablet, Rfl: 3 .  pravastatin (PRAVACHOL) 20 MG tablet, Take 1 tablet (20 mg total) by mouth daily. (Needs to be seen before next refill), Disp: 30 tablet, Rfl: 0 .  telmisartan (MICARDIS) 20 MG tablet, Take 1 tablet (20 mg total) by mouth daily., Disp: 90 tablet, Rfl: 0 .  traMADol (ULTRAM) 50 MG tablet, Take 1 tablet (50 mg total) by mouth every 12 (twelve) hours as needed for moderate pain or severe pain., Disp: 60 tablet, Rfl: 1 .  triamcinolone cream (KENALOG) 0.1 %, Apply scant amount to finger and rub along ear canal twice daily for 1 week as needed for itching/ dermatitis, Disp: 30 g, Rfl: 0 .  vitamin B-12 (CYANOCOBALAMIN) 500 MCG tablet, 500 mcg., Disp: , Rfl:   Assessment/ Plan: 74 y.o. female   COVID-19 virus infection  Stage 3b chronic kidney disease (HCC)  Counseled and offered monoclonal antibody treatment but she wants to hold off on this for another day.  Symptomatically she is not having any shortness of breath or red flag signs and symptoms.  Encourage p.o. hydration, symptom management.  Would benefit from COVID-19 vaccination at some point.  Plan  for renal function panel at next visit.  For now, continue current medications.  Refills have been sent.  Meds ordered this encounter  Medications  . pravastatin (PRAVACHOL) 20 MG tablet    Sig: Take 1 tablet (20 mg total) by mouth daily.    Dispense:  90 tablet    Refill:  3  . alendronate (FOSAMAX) 70 MG tablet    Sig: TAKE 1 TABLET BY MOUTH ONCE A WEEK WITH A FULL GLASS OF WATER ON AN EMPTY STOMACH    Dispense:  12 tablet    Refill:  3  . telmisartan (MICARDIS) 20 MG tablet    Sig: Take 1 tablet (20 mg total) by mouth daily.    Dispense:   90 tablet    Refill:  3     Start time: 9:51am (no answer); 10:21am End time: 10:29am  Total time spent on patient care (including telephone call/ virtual visit): 8 minutes  Ashly Hulen Skains, DO [facility] [phone]

## 2020-02-21 ENCOUNTER — Encounter: Payer: Self-pay | Admitting: Family Medicine

## 2020-02-21 ENCOUNTER — Other Ambulatory Visit: Payer: Self-pay

## 2020-02-21 ENCOUNTER — Ambulatory Visit (INDEPENDENT_AMBULATORY_CARE_PROVIDER_SITE_OTHER): Payer: Medicare Other | Admitting: Family Medicine

## 2020-02-21 VITALS — BP 148/86 | HR 96 | Temp 97.9°F | Ht 67.0 in | Wt 158.0 lb

## 2020-02-21 DIAGNOSIS — R300 Dysuria: Secondary | ICD-10-CM | POA: Diagnosis not present

## 2020-02-21 DIAGNOSIS — N1831 Chronic kidney disease, stage 3a: Secondary | ICD-10-CM | POA: Diagnosis not present

## 2020-02-21 DIAGNOSIS — Z23 Encounter for immunization: Secondary | ICD-10-CM | POA: Diagnosis not present

## 2020-02-21 DIAGNOSIS — N3000 Acute cystitis without hematuria: Secondary | ICD-10-CM

## 2020-02-21 DIAGNOSIS — Z9049 Acquired absence of other specified parts of digestive tract: Secondary | ICD-10-CM | POA: Diagnosis not present

## 2020-02-21 DIAGNOSIS — I10 Essential (primary) hypertension: Secondary | ICD-10-CM

## 2020-02-21 DIAGNOSIS — F4321 Adjustment disorder with depressed mood: Secondary | ICD-10-CM | POA: Diagnosis not present

## 2020-02-21 LAB — URINALYSIS, COMPLETE
Bilirubin, UA: NEGATIVE
Glucose, UA: NEGATIVE
Ketones, UA: NEGATIVE
Nitrite, UA: NEGATIVE
Protein,UA: NEGATIVE
RBC, UA: NEGATIVE
Specific Gravity, UA: 1.02 (ref 1.005–1.030)
Urobilinogen, Ur: 0.2 mg/dL (ref 0.2–1.0)
pH, UA: 6 (ref 5.0–7.5)

## 2020-02-21 LAB — MICROSCOPIC EXAMINATION: RBC, Urine: NONE SEEN /HPF (ref 0–2)

## 2020-02-21 MED ORDER — CEPHALEXIN 500 MG PO CAPS
500.0000 mg | ORAL_CAPSULE | Freq: Two times a day (BID) | ORAL | 0 refills | Status: AC
Start: 2020-02-21 — End: 2020-02-28

## 2020-02-21 MED ORDER — TELMISARTAN 20 MG PO TABS: 20.0000 mg | ORAL_TABLET | Freq: Every day | ORAL | 3 refills | Status: DC

## 2020-02-21 NOTE — Progress Notes (Signed)
Subjective: CC: Follow-up hypertension, hyperlipidemia PCP: [name], DO RDE:YCXK Vanessa Pitts is a 75 y.o. female presenting to clinic today for:  1.  Hypertension with hyperlipidemia, CKD 3 Compliant with Micardis 20 mg daily, Pravachol 20 mg daily.  No chest pain, shortness of breath, falls or dizziness  2. Grief/bloating Patient recently lost her spouse.  She is very tearful about this.  She is currently residing in [place] with her daughter and there are plans to potentially move in with other children as well.  She does feel well supported by her children.  She has lost a little weight but attributes this to her gallbladder surgery.  She has felt bloated intermittently since then and has had lots of gas production but denies any diarrhea.  She has not modified her diet per se   ROS: Per HPI  No Known Allergies Past Medical History:  Diagnosis Date  . Chronically dry eyes   . Hyperlipidemia   . Hypertension   . Leg pain, left   . Osteoporosis   . Ovarian cyst     Current Outpatient Medications:  .  alendronate (FOSAMAX) 70 MG tablet, TAKE 1 TABLET BY MOUTH ONCE A WEEK WITH A FULL GLASS OF WATER ON AN EMPTY STOMACH, Disp: 12 tablet, Rfl: 3 .  doxycycline (MONODOX) 50 MG capsule, Take 50 mg by mouth daily., Disp: , Rfl:  .  EQ ALLERGY RELIEF, CETIRIZINE, 10 MG tablet, Take 1/2 (one-half) tablet by mouth once daily, Disp: 45 tablet, Rfl: 0 .  fluticasone (FLONASE) 50 MCG/ACT nasal spray, Place 1 spray into both nostrils 2 (two) times daily as needed for allergies or rhinitis., Disp: 16 g, Rfl: 6 .  pantoprazole (PROTONIX) 20 MG tablet, Take 1 tablet by mouth once daily, Disp: 90 tablet, Rfl: 3 .  pravastatin (PRAVACHOL) 20 MG tablet, Take 1 tablet (20 mg total) by mouth daily., Disp: 90 tablet, Rfl: 3 .  telmisartan (MICARDIS) 20 MG tablet, Take 1 tablet (20 mg total) by mouth daily., Disp: 90 tablet, Rfl: 3 .  traMADol (ULTRAM) 50 MG tablet, Take 1 tablet (50 mg total)  by mouth every 12 (twelve) hours as needed for moderate pain or severe pain., Disp: 60 tablet, Rfl: 1 .  triamcinolone cream (KENALOG) 0.1 %, Apply scant amount to finger and rub along ear canal twice daily for 1 week as needed for itching/ dermatitis, Disp: 30 g, Rfl: 0 .  vitamin B-12 (CYANOCOBALAMIN) 500 MCG tablet, 500 mcg., Disp: , Rfl:  Social History   Socioeconomic History  . Marital status: Married    Spouse name: Shirlee Limerick  . Number of children: 3  . Years of education: 44  . Highest education level: Some college, no degree  Occupational History  . Occupation: retired  Tobacco Use  . Smoking status: Never Smoker  . Smokeless tobacco: Never Used  Substance and Sexual Activity  . Alcohol use: No  . Drug use: No  . Sexual activity: Yes    Birth control/protection: Post-menopausal  Other Topics Concern  . Not on file  Social History Narrative  . Not on file   Social Determinants of Health   Financial Resource Strain: Not on file  Food Insecurity: Not on file  Transportation Needs: Not on file  Physical Activity: Not on file  Stress: Not on file  Social Connections: Not on file  Intimate Partner Violence: Not on file   Family History  Problem Relation Age of Onset  . Heart disease Mother   .  Diabetes Mother   . Hyperlipidemia Mother   . Hypertension Mother   . Arthritis Mother   . Obesity Mother   . Arthritis Father     Objective: Office vital signs reviewed. BP (!) 148/86   Pulse 96   Temp 97.9 F (36.6 C) (Temporal)   Ht 5\' 7"  (1.702 m)   Wt 158 lb (71.7 kg)   BMI 24.75 kg/m   Physical Examination:  General: Awake, alert, No acute distress HEENT: Normal; sclera injected. Cardio: regular rate and rhythm, S1S2 heard, no murmurs appreciated Pulm: clear to auscultation bilaterally, no wheezes, rhonchi or rales; normal work of breathing on room air Psych: Patient is tearful and appears tired  Depression screen [facility] 2/9 02/21/2020 09/06/2019 06/07/2019   Decreased Interest 1 0 0  Down, Depressed, Hopeless 1 0 0  PHQ - 2 Score 2 0 0  Altered sleeping 1 0 0  Tired, decreased energy 1 0 0  Change in appetite 1 0 0  Feeling bad or failure about yourself  0 0 0  Trouble concentrating 1 0 0  Moving slowly or fidgety/restless 0 0 0  Suicidal thoughts 0 0 0  PHQ-9 Score 6 0 0  Difficult doing work/chores Somewhat difficult - -  Some recent data might be hidden     Assessment/ Plan: 75 y.o. female   Acute cystitis without hematuria - Plan: Urinalysis, Complete  Grief reaction  Stage 3a chronic kidney disease (HCC) - Plan: Renal Function Panel, CBC with Differential, VITAMIN D 25 Hydroxy (Vit-D Deficiency, Fractures)  Essential hypertension - Plan: Renal Function Panel, Lipid panel  S/P cholecystectomy  She has some white blood cell and small amounts of bacteria in her urine.  Empiric treatment with Keflex twice daily sent  She will come in for fasting labs, to check renal function and vitamin D  She clearly is grieving after the loss of her husband.  I have encouraged her to contact me should any needs arise.  For now she feels well supported by her family.  I would like to see her back in the next couple of months just to check on her intervally.  I did offer counseling services but she declined this today  Blood pressure was not controlled initially but did come down some after she was allowed to rest.  With regards to her bloating, I given her a handout for dietary recommendations status post gallbladder removal.  Orders Placed This Encounter  Procedures  . Urinalysis, Complete   No orders of the defined types were placed in this encounter.    66, DO [facility] [phone]

## 2020-02-21 NOTE — Patient Instructions (Signed)
Gallbladder Eating Plan If you have a gallbladder condition, you may have trouble digesting fats. Eating a low-fat diet can help reduce your symptoms, and may be helpful before and after having surgery to remove your gallbladder (cholecystectomy). Your health care provider may recommend that you work with a diet and nutrition specialist (dietitian) to help you reduce the amount of fat in your diet. What are tips for following this plan? General guidelines  Limit your fat intake to less than 30% of your total daily calories. If you eat around 1,800 calories each day, this is less than 60 grams (g) of fat per day.  Fat is an important part of a healthy diet. Eating a low-fat diet can make it hard to maintain a healthy body weight. Ask your dietitian how much fat, calories, and other nutrients you need each day.  Eat small, frequent meals throughout the day instead of three large meals.  Drink at least 8-10 cups of fluid a day. Drink enough fluid to keep your urine clear or pale yellow.  Limit alcohol intake to no more than 1 drink a day for nonpregnant women and 2 drinks a day for men. One drink equals 12 oz of beer, 5 oz of wine, or 1 oz of hard liquor. Reading food labels  Check Nutrition Facts on food labels for the amount of fat per serving. Choose foods with less than 3 grams of fat per serving.   Shopping  Choose nonfat and low-fat healthy foods. Look for the words "nonfat," "low fat," or "fat free."  Avoid buying processed or prepackaged foods. Cooking  Cook using low-fat methods, such as baking, broiling, grilling, or boiling.  Cook with small amounts of healthy fats, such as olive oil, grapeseed oil, canola oil, or sunflower oil. What foods are recommended?  All fresh, frozen, or canned fruits and vegetables.  Whole grains.  Low-fat or non-fat (skim) milk and yogurt.  Lean meat, skinless poultry, fish, eggs, and beans.  Low-fat protein supplement powders or  drinks.  Spices and herbs. What foods are not recommended?  High-fat foods. These include baked goods, fast food, fatty cuts of meat, ice cream, french toast, sweet rolls, pizza, cheese bread, foods covered with butter, creamy sauces, or cheese.  Fried foods. These include french fries, tempura, battered fish, breaded chicken, fried breads, and sweets.  Foods with strong odors.  Foods that cause bloating and gas. Summary  A low-fat diet can be helpful if you have a gallbladder condition, or before and after gallbladder surgery.  Limit your fat intake to less than 30% of your total daily calories. This is about 60 g of fat if you eat 1,800 calories each day.  Eat small, frequent meals throughout the day instead of three large meals. This information is not intended to replace advice given to you by your health care provider. Make sure you discuss any questions you have with your health care provider. Document Revised: 09/14/2019 Document Reviewed: 09/14/2019 Elsevier Patient Education  2021 Elsevier Inc.  

## 2020-04-01 ENCOUNTER — Telehealth: Payer: Self-pay

## 2020-04-01 ENCOUNTER — Other Ambulatory Visit: Payer: Self-pay | Admitting: Family Medicine

## 2020-04-01 DIAGNOSIS — M47816 Spondylosis without myelopathy or radiculopathy, lumbar region: Secondary | ICD-10-CM

## 2020-04-01 NOTE — Telephone Encounter (Signed)
Given to Almira Coaster to fax to [facility] imaging [phone]

## 2020-04-01 NOTE — Telephone Encounter (Signed)
Pt called stating that [facility] needs Dr Nadine Counts to fax them order for what kind of shot pt needs for her leg regarding her pinched nerve.

## 2020-04-05 ENCOUNTER — Other Ambulatory Visit: Payer: Self-pay

## 2020-04-05 ENCOUNTER — Ambulatory Visit
Admission: RE | Admit: 2020-04-05 | Discharge: 2020-04-05 | Disposition: A | Discharge status: Home / Self Care | Payer: Medicare Other | Source: Ambulatory Visit | Attending: Family Medicine | Admitting: Family Medicine

## 2020-04-05 DIAGNOSIS — M47816 Spondylosis without myelopathy or radiculopathy, lumbar region: Secondary | ICD-10-CM

## 2020-04-05 MED ORDER — METHYLPREDNISOLONE ACETATE 40 MG/ML INJ SUSP (RADIOLOG
120.0000 mg | Freq: Once | INTRAMUSCULAR | Status: AC
  Administered 2020-04-05: 120 mg via EPIDURAL

## 2020-04-05 MED ORDER — IOPAMIDOL (ISOVUE-M 200) INJECTION 41%
1.0000 mL | Freq: Once | INTRAMUSCULAR | Status: AC
  Administered 2020-04-05: 1 mL via EPIDURAL

## 2020-04-05 NOTE — Discharge Instructions (Signed)

## 2020-08-05 ENCOUNTER — Telehealth: Payer: Self-pay | Admitting: Family Medicine

## 2020-08-05 NOTE — Telephone Encounter (Signed)
Needs information faxed to Dr Mosetta Putt for shot for her pinched nerve. Pt does not know fax number because she is not home. [place] Spaniel that she gets this every three months.

## 2020-08-13 ENCOUNTER — Telehealth: Payer: Self-pay | Admitting: Family Medicine

## 2020-08-13 NOTE — Telephone Encounter (Signed)
[email or web address]

## 2020-08-15 ENCOUNTER — Telehealth: Payer: Self-pay

## 2020-08-15 NOTE — Telephone Encounter (Signed)
Patient called states that she needs order sent to [facility] for Dr. Mosetta Putt to be able to do lumbar epidural injection.  Last one was done 04/05/2020.  Patient is currently out of town but will return this weekend and will like to have done as soon as possible.

## 2020-08-19 ENCOUNTER — Other Ambulatory Visit: Payer: Self-pay | Admitting: Family Medicine

## 2020-08-19 ENCOUNTER — Telehealth: Payer: Self-pay | Admitting: Family Medicine

## 2020-08-19 DIAGNOSIS — M47816 Spondylosis without myelopathy or radiculopathy, lumbar region: Secondary | ICD-10-CM

## 2020-08-19 NOTE — Telephone Encounter (Signed)
Placed on Vanessa Pitts's desk to be faxed

## 2020-09-03 ENCOUNTER — Ambulatory Visit
Admission: RE | Admit: 2020-09-03 | Discharge: 2020-09-03 | Disposition: A | Discharge status: Home / Self Care | Payer: Medicare Other | Source: Ambulatory Visit | Attending: Family Medicine | Admitting: Family Medicine

## 2020-09-03 ENCOUNTER — Other Ambulatory Visit: Payer: Self-pay

## 2020-09-03 DIAGNOSIS — M47816 Spondylosis without myelopathy or radiculopathy, lumbar region: Secondary | ICD-10-CM

## 2020-09-03 MED ORDER — IOPAMIDOL (ISOVUE-M 200) INJECTION 41%
1.0000 mL | Freq: Once | INTRAMUSCULAR | Status: AC
  Administered 2020-09-03: 1 mL via EPIDURAL

## 2020-09-03 MED ORDER — METHYLPREDNISOLONE ACETATE 40 MG/ML INJ SUSP (RADIOLOG
80.0000 mg | Freq: Once | INTRAMUSCULAR | Status: AC
  Administered 2020-09-03: 80 mg via EPIDURAL

## 2020-09-03 NOTE — Discharge Instructions (Signed)

## 2020-10-18 ENCOUNTER — Other Ambulatory Visit: Payer: Self-pay | Admitting: Family Medicine

## 2021-01-13 ENCOUNTER — Ambulatory Visit (INDEPENDENT_AMBULATORY_CARE_PROVIDER_SITE_OTHER): Payer: Medicare Other

## 2021-01-13 ENCOUNTER — Ambulatory Visit: Payer: Medicare Other | Admitting: Family Medicine

## 2021-01-13 DIAGNOSIS — Z23 Encounter for immunization: Secondary | ICD-10-CM

## 2021-01-14 ENCOUNTER — Other Ambulatory Visit: Payer: Self-pay | Admitting: Family Medicine

## 2021-01-17 ENCOUNTER — Ambulatory Visit (INDEPENDENT_AMBULATORY_CARE_PROVIDER_SITE_OTHER): Payer: Medicare Other | Admitting: Family Medicine

## 2021-01-17 ENCOUNTER — Encounter: Payer: Self-pay | Admitting: Family Medicine

## 2021-01-17 ENCOUNTER — Other Ambulatory Visit: Payer: Self-pay | Admitting: Family Medicine

## 2021-01-17 VITALS — BP 192/103 | HR 88 | Temp 97.8°F | Ht 67.0 in | Wt 160.2 lb

## 2021-01-17 DIAGNOSIS — E785 Hyperlipidemia, unspecified: Secondary | ICD-10-CM | POA: Diagnosis not present

## 2021-01-17 DIAGNOSIS — N1831 Chronic kidney disease, stage 3a: Secondary | ICD-10-CM | POA: Diagnosis not present

## 2021-01-17 DIAGNOSIS — M48062 Spinal stenosis, lumbar region with neurogenic claudication: Secondary | ICD-10-CM

## 2021-01-17 DIAGNOSIS — I10 Essential (primary) hypertension: Secondary | ICD-10-CM

## 2021-01-17 DIAGNOSIS — M62838 Other muscle spasm: Secondary | ICD-10-CM

## 2021-01-17 DIAGNOSIS — M4307 Spondylolysis, lumbosacral region: Secondary | ICD-10-CM

## 2021-01-17 MED ORDER — TIZANIDINE HCL 4 MG PO TABS: 2.0000 mg | ORAL_TABLET | Freq: Three times a day (TID) | ORAL | 0 refills | Status: DC | PRN

## 2021-01-17 NOTE — Progress Notes (Signed)
Subjective: CC: Hypertension with CKD 3, hyperlipidemia, back pain PCP: Janora Norlander, DO [id number] Sessler is a 75 y.o. female presenting to clinic today for:  1.  Hypertension with hyperlipidemia associated with CKD 3A Patient is compliant with Micardis 20 mg daily but she did not take the medicine today because she wanted to do fasting labs and it makes her sick on her stomach without eating.  She is also compliant with Pravachol 20 mg daily.  Denies any chest pain, shortness of breath, dizziness  2.  Back pain Patient is treated with caudal epidurals by Dr. Jeralyn Ruths at [facility] for back pain.  Her last injection was almost 5 months ago and in fact she has noticed the last couple of injection she has had lasted longer than previous.  She is quite pleased about this.  She would like to go and have another order sent over to the specialist in anticipation of her next back injection.  3.  Neck pain Patient reports acute neck spasm.  She woke up that way this morning.  She has difficulty turning her neck to the left.  Cannot take oral NSAIDs secondary to CKD but does have some tramadol on hand if needed.  Has not taken this today.  No reports of upper extremity weakness or sensory changes   ROS: Per HPI  No Known Allergies Past Medical History:  Diagnosis Date   Chronically dry eyes    Hyperlipidemia    Hypertension    Leg pain, left    Osteoporosis    Ovarian cyst     Current Outpatient Medications:    alendronate (FOSAMAX) 70 MG tablet, TAKE 1 TABLET BY MOUTH ONCE A WEEK WITH A FULL GLASS OF WATER ON AN EMPTY STOMACH, Disp: 12 tablet, Rfl: 3   EQ ALLERGY RELIEF, CETIRIZINE, 10 MG tablet, Take 1/2 (one-half) tablet by mouth once daily, Disp: 45 tablet, Rfl: 0   fluticasone (FLONASE) 50 MCG/ACT nasal spray, Place 1 spray into both nostrils 2 (two) times daily as needed for allergies or rhinitis., Disp: 16 g, Rfl: 6   pantoprazole (PROTONIX) 20 MG tablet, Take  1 tablet by mouth once daily, Disp: 90 tablet, Rfl: 0   pravastatin (PRAVACHOL) 20 MG tablet, Take 1 tablet (20 mg total) by mouth daily., Disp: 90 tablet, Rfl: 3   telmisartan (MICARDIS) 20 MG tablet, Take 1 tablet (20 mg total) by mouth daily., Disp: 90 tablet, Rfl: 3   traMADol (ULTRAM) 50 MG tablet, Take 1 tablet (50 mg total) by mouth every 12 (twelve) hours as needed for moderate pain or severe pain., Disp: 60 tablet, Rfl: 1   triamcinolone cream (KENALOG) 0.1 %, Apply scant amount to finger and rub along ear canal twice daily for 1 week as needed for itching/ dermatitis, Disp: 30 g, Rfl: 0   vitamin B-12 (CYANOCOBALAMIN) 500 MCG tablet, 500 mcg., Disp: , Rfl:  Social History   Socioeconomic History   Marital status: Married    Spouse name: [name]   Number of children: 3   Years of education: 13   Highest education level: Some college, no degree  Occupational History   Occupation: retired  Tobacco Use   Smoking status: Never   Smokeless tobacco: Never  Substance and Sexual Activity   Alcohol use: No   Drug use: No   Sexual activity: Yes    Birth control/protection: Post-menopausal  Other Topics Concern   Not on file  Social History Narrative   Not on  file   Social Determinants of Health   Financial Resource Strain: Not on file  Food Insecurity: Not on file  Transportation Needs: Not on file  Physical Activity: Not on file  Stress: Not on file  Social Connections: Not on file  Intimate Partner Violence: Not on file   Family History  Problem Relation Age of Onset   Heart disease Mother    Diabetes Mother    Hyperlipidemia Mother    Hypertension Mother    Arthritis Mother    Obesity Mother    Arthritis Father     Objective: Office vital signs reviewed. BP (!) 192/103   Pulse 88   Temp 97.8 F (36.6 C)   Ht 5\' 7"  (1.702 m)   Wt 160 lb 3.2 oz (72.7 kg)   SpO2 97%   BMI 25.09 kg/m   Physical Examination:  General: Awake, alert, well nourished, No acute  distress HEENT: Normal; sclera white.  Moist mucous membranes Cardio: regular rate and rhythm, S1S2 heard, no murmurs appreciated Pulm: clear to auscultation bilaterally, no wheezes, rhonchi or rales; normal work of breathing on room air MSK: Ambulating independently  C-spine: About a 10 degree loss of extension.  She has full flexion.  Reduction about 25 degrees and left rotation and about 10 degrees and right rotation.  No midline tenderness palpation.  No palpable bony abnormalities Neuro: Voice somewhat shaky  Assessment/ Plan: 75 y.o. female   Essential hypertension - Plan: Lipid panel, Renal Function Panel  Stage 3a chronic kidney disease (HCC) - Plan: VITAMIN D 25 Hydroxy (Vit-D Deficiency, Fractures), CBC with Differential, Renal Function Panel  Hyperlipidemia LDL goal <130 - Plan: Lipid panel  Spinal stenosis, lumbar region with neurogenic claudication  Neck muscle spasm - Plan: tiZANidine (ZANAFLEX) 4 MG tablet  Blood pressure not at goal but patient has not taken her medication for the day.  Of asked that she return within the next couple of weeks for blood pressure check with nurse.  May need to consider advancing the Micardis dose.  Check renal function panel, vitamin D, CBC given known CKD 3  Fasting lipid panel to be obtained today.  She will continue Pravachol pending labs  For her spinal stenosis I have placed a faxed order to her specialist Dr. [id number] at [facility] imaging for caudal epidural  Sounds like she is having a benign neck spasm.  Her physical exam did not demonstrate any red flag signs or symptoms.  Tizanidine sent for as needed use.  Use sparingly.  Caution sedation.  Heat, stretching recommended.  No orders of the defined types were placed in this encounter.  No orders of the defined types were placed in this encounter.    [facility], DO [facility] [phone]

## 2021-01-18 LAB — LIPID PANEL
Chol/HDL Ratio: 2.3 ratio (ref 0.0–4.4)
Cholesterol, Total: 161 mg/dL (ref 100–199)
HDL: 71 mg/dL (ref >39)
LDL Chol Calc (NIH): 78 mg/dL (ref 0–99)
Triglycerides: 62 mg/dL (ref 0–149)
VLDL Cholesterol Cal: 12 mg/dL (ref 5–40)

## 2021-01-18 LAB — CBC WITH DIFFERENTIAL/PLATELET
Basophils Absolute: 0 10*3/uL (ref 0.0–0.2)
Basos: 1 %
EOS (ABSOLUTE): 0.1 10*3/uL (ref 0.0–0.4)
Eos: 1 %
Hematocrit: 43.3 % (ref 34.0–46.6)
Hemoglobin: 14.6 g/dL (ref 11.1–15.9)
Immature Grans (Abs): 0 10*3/uL (ref 0.0–0.1)
Immature Granulocytes: 0 %
Lymphocytes Absolute: 1.4 10*3/uL (ref 0.7–3.1)
Lymphs: 32 %
MCH: 29 pg (ref 26.6–33.0)
MCHC: 33.7 g/dL (ref 31.5–35.7)
MCV: 86 fL (ref 79–97)
Monocytes Absolute: 0.5 10*3/uL (ref 0.1–0.9)
Monocytes: 11 %
Neutrophils Absolute: 2.5 10*3/uL (ref 1.4–7.0)
Neutrophils: 55 %
Platelets: 244 10*3/uL (ref 150–450)
RBC: 5.04 x10E6/uL (ref 3.77–5.28)
RDW: 11.8 % (ref 11.7–15.4)
WBC: 4.5 10*3/uL (ref 3.4–10.8)

## 2021-01-18 LAB — VITAMIN D 25 HYDROXY (VIT D DEFICIENCY, FRACTURES): Vit D, 25-Hydroxy: 60.9 ng/mL (ref 30.0–100.0)

## 2021-01-18 LAB — RENAL FUNCTION PANEL
Albumin: 4.5 g/dL (ref 3.7–4.7)
BUN/Creatinine Ratio: 11 — ABNORMAL LOW (ref 12–28)
BUN: 11 mg/dL (ref 8–27)
CO2: 26 mmol/L (ref 20–29)
Calcium: 9.4 mg/dL (ref 8.7–10.3)
Chloride: 101 mmol/L (ref 96–106)
Creatinine, Ser: 0.96 mg/dL (ref 0.57–1.00)
Glucose: 107 mg/dL — ABNORMAL HIGH (ref 70–99)
Phosphorus: 2.7 mg/dL — ABNORMAL LOW (ref 3.0–4.3)
Potassium: 4.7 mmol/L (ref 3.5–5.2)
Sodium: 138 mmol/L (ref 134–144)
eGFR: 62 mL/min/{1.73_m2} (ref >59)

## 2021-01-27 ENCOUNTER — Ambulatory Visit (INDEPENDENT_AMBULATORY_CARE_PROVIDER_SITE_OTHER): Payer: Medicare Other

## 2021-01-27 VITALS — Ht 67.0 in | Wt 161.0 lb

## 2021-01-27 DIAGNOSIS — Z0000 Encounter for general adult medical examination without abnormal findings: Secondary | ICD-10-CM

## 2021-01-27 NOTE — Progress Notes (Signed)
Subjective:   Vanessa Pitts is a 75 y.o. female who presents for Medicare Annual (Subsequent) preventive examination. Virtual Visit via Telephone Note  I connected with  Vanessa Pitts on 01/27/21 at 10:30 AM EST by telephone and verified that I am speaking with the correct person using two identifiers.  Location: Patient: Home Provider: [facility] Persons participating in the virtual visit: patient/Nurse Health Advisor   I discussed the limitations, risks, security and privacy concerns of performing an evaluation and management service by telephone and the availability of in person appointments. The patient expressed understanding and agreed to proceed.  Interactive audio and video telecommunications were attempted between this nurse and patient, however failed, due to patient having technical difficulties OR patient did not have access to video capability.  We continued and completed visit with audio only.  Some vital signs may be absent or patient reported.   Darral Dash, LPN  Review of Systems     Cardiac Risk Factors include: advanced age (>73men, >46 women);hypertension;sedentary lifestyle     Objective:    Today's Vitals   01/27/21 1037  Weight: 161 lb (73 kg)  Height:  (1.702 m)   Body mass index is 25.22 kg/m.  Advanced Directives 01/27/2021 09/13/2018  Does Patient Have a Medical Advance Directive? No No  Would patient like information on creating a medical advance directive? No - Patient declined No - Patient declined    Current Medications (verified) Outpatient Encounter Medications as of 01/27/2021  Medication Sig   alendronate (FOSAMAX) 70 MG tablet TAKE 1 TABLET BY MOUTH ONCE A WEEK WITH A FULL GLASS OF WATER ON AN EMPTY STOMACH   EQ ALLERGY RELIEF, CETIRIZINE, 10 MG tablet Take 1/2 (one-half) tablet by mouth once daily   fluticasone (FLONASE) 50 MCG/ACT nasal spray Place 1 spray into both nostrils 2 (two) times daily as needed for allergies or rhinitis.    pantoprazole (PROTONIX) 20 MG tablet Take 1 tablet by mouth once daily   pravastatin (PRAVACHOL) 20 MG tablet Take 1 tablet (20 mg total) by mouth daily.   telmisartan (MICARDIS) 20 MG tablet Take 1 tablet (20 mg total) by mouth daily.   tiZANidine (ZANAFLEX) 4 MG tablet Take 0.5-1 tablets (2-4 mg total) by mouth every 8 (eight) hours as needed for muscle spasms.   traMADol (ULTRAM) 50 MG tablet Take 1 tablet (50 mg total) by mouth every 12 (twelve) hours as needed for moderate pain or severe pain.   triamcinolone cream (KENALOG) 0.1 % Apply scant amount to finger and rub along ear canal twice daily for 1 week as needed for itching/ dermatitis   vitamin B-12 (CYANOCOBALAMIN) 500 MCG tablet 500 mcg.   No facility-administered encounter medications on file as of 01/27/2021.    Allergies (verified) Patient has no known allergies.   History: Past Medical History:  Diagnosis Date   Chronically dry eyes    Hyperlipidemia    Hypertension    Leg pain, left    Osteoporosis    Ovarian cyst    Past Surgical History:  Procedure Laterality Date   cataract Bilateral 2017   Family History  Problem Relation Age of Onset   Heart disease Mother    Diabetes Mother    Hyperlipidemia Mother    Hypertension Mother    Arthritis Mother    Obesity Mother    Arthritis Father    Social History   Socioeconomic History   Marital status: Widowed    Spouse name: Shirlee Limerick  Number of children: 3   Years of education: 13   Highest education level: Some college, no degree  Occupational History   Occupation: retired  Tobacco Use   Smoking status: Never   Smokeless tobacco: Never  Substance and Sexual Activity   Alcohol use: No   Drug use: No   Sexual activity: Yes    Birth control/protection: Post-menopausal  Other Topics Concern   Not on file  Social History Narrative   Husband passed away in 05-24-2019.   Pt lives with daughter.1 son and 2 daughters.    Social Determinants of Health    Financial Resource Strain: Low Risk    Difficulty of Paying Living Expenses: Not hard at all  Food Insecurity: No Food Insecurity   Worried About Programme researcher, broadcasting/film/video in the Last Year: Never true   Ran Out of Food in the Last Year: Never true  Transportation Needs: No Transportation Needs   Lack of Transportation (Medical): No   Lack of Transportation (Non-Medical): No  Physical Activity: Insufficiently Active   Days of Exercise per Week: 5 days   Minutes of Exercise per Session: 20 min  Stress: No Stress Concern Present   Feeling of Stress : Not at all  Social Connections: Socially Isolated   Frequency of Communication with Friends and Family: More than three times a week   Frequency of Social Gatherings with Friends and Family: More than three times a week   Attends Religious Services: Never   Database administrator or Organizations: No   Attends Banker Meetings: Never   Marital Status: Widowed    Tobacco Counseling Counseling given: Not Answered   Clinical Intake:  Pre-visit preparation completed: Yes  Pain : No/denies pain     BMI - recorded: 25.22 Nutritional Status: BMI 25 -29 Overweight Nutritional Risks: None Diabetes: No  How often do you need to have someone help you when you read instructions, pamphlets, or other written materials from your doctor or pharmacy?: 1 - Never  Diabetic?No  Interpreter Needed?: No  Information entered by :: mj, Zuleica Seith, lpn   Activities of Daily Living In your present state of health, do you have any difficulty performing the following activities: 01/27/2021  Hearing? N  Vision? N  Difficulty concentrating or making decisions? N  Walking or climbing stairs? N  Dressing or bathing? N  Doing errands, shopping? N  Preparing Food and eating ? N  Using the Toilet? N  In the past six months, have you accidently leaked urine? Y  Comment Occasional  Do you have problems with loss of bowel control? N  Managing  your Medications? N  Managing your Finances? N  Housekeeping or managing your Housekeeping? N  Some recent data might be hidden    Patient Care Team: [name], DO as PCP - General (Family Medicine)  Indicate any recent Medical Services you may have received from other than [facility] providers in the past year (date may be approximate).     Assessment:   This is a routine wellness examination for Vanessa Pitts.  Hearing/Vision screen Hearing Screening - Comments:: Wears hearing aids. Vision Screening - Comments:: Has had permanent lenses placed. [facility]. 05-23-2020.  Dietary issues and exercise activities discussed: Current Exercise Habits: Home exercise routine, Type of exercise: walking, Time (Minutes): 20, Frequency (Times/Week): 5, Weekly Exercise (Minutes/Week): 100, Intensity: Mild, Exercise limited by: cardiac condition(s);neurologic condition(s)   Goals Addressed  This Visit's Progress    DIET - INCREASE WATER INTAKE   On track    Try to drink 6-8 glasses of water daily.       Depression Screen PHQ 2/9 Scores 01/27/2021 01/17/2021 02/21/2020 09/06/2019 06/07/2019 05/29/2019 02/20/2019  PHQ - 2 Score 0 1 2 0 0 0 0  PHQ- 9 Score - 3 6 0 0 - 0    Fall Risk Fall Risk  01/27/2021 01/17/2021 02/21/2020 09/06/2019 06/07/2019  Falls in the past year? 0 0 0 0 0  Comment - - - - -  Number falls in past yr: 0 - - - -  Injury with Fall? 0 - - - -  Risk for fall due to : No Fall Risks - - - -  Follow up Falls prevention discussed - - - -    FALL RISK PREVENTION PERTAINING TO THE HOME:  Any stairs in or around the home? Yes  If so, are there any without handrails? No  Home free of loose throw rugs in walkways, pet beds, electrical cords, etc? Yes  Adequate lighting in your home to reduce risk of falls? Yes   ASSISTIVE DEVICES UTILIZED TO PREVENT FALLS:  Life alert? No  Use of a cane, walker or w/c? No  Grab bars in the bathroom? Yes  Shower chair or bench in  shower? Yes  Elevated toilet seat or a handicapped toilet? Yes   TIMED UP AND GO:  Was the test performed? No .  Phone visit.   Cognitive Function:     6CIT Screen 01/27/2021 09/13/2018  What Year? 0 points 0 points  What month? 0 points 0 points  What time? 0 points 0 points  Count back from 20 0 points 0 points  Months in reverse 0 points 0 points  Repeat phrase 4 points 2 points  Total Score 4 2    Immunizations Immunization History  Administered Date(s) Administered   Fluad Quad(high Dose 65+) 11/11/2018, 02/21/2020, 01/13/2021   Influenza, High Dose Seasonal PF 11/22/2017   Pneumococcal Conjugate-13 03/21/2018   Pneumococcal Polysaccharide-23 06/07/2019    TDAP status: Due, Education has been provided regarding the importance of this vaccine. Advised may receive this vaccine at local pharmacy or Health Dept. Aware to provide a copy of the vaccination record if obtained from local pharmacy or Health Dept. Verbalized acceptance and understanding.  Flu Vaccine status: Up to date  Pneumococcal vaccine status: Up to date  Covid-19 vaccine status: Declined, Education has been provided regarding the importance of this vaccine but patient still declined. Advised may receive this vaccine at local pharmacy or Health Dept.or vaccine clinic. Aware to provide a copy of the vaccination record if obtained from local pharmacy or Health Dept. Verbalized acceptance and understanding.  Qualifies for Shingles Vaccine? Yes   Zostavax completed No   Shingrix Completed?: No.    Education has been provided regarding the importance of this vaccine. Patient has been advised to call insurance company to determine out of pocket expense if they have not yet received this vaccine. Advised may also receive vaccine at local pharmacy or Health Dept. Verbalized acceptance and understanding.  Screening Tests Health Maintenance  Topic Date Due   Zoster Vaccines- Shingrix (1 of 2) 04/17/2021 (Originally  12/16/1964)   COLONOSCOPY (Pts 45-10yrs Insurance coverage will need to be confirmed)  01/17/2022 (Originally [birth year]17/1990)   TETANUS/TDAP  01/17/2022 (Originally 12/16/1964)   Pneumonia Vaccine 21+ Years old  Completed   INFLUENZA VACCINE  Completed  DEXA SCAN  Completed   Hepatitis C Screening  Completed   HPV VACCINES  Aged Out   COVID-19 Vaccine  Discontinued    Health Maintenance  There are no preventive care reminders to display for this patient.  Colorectal cancer screening: Type of screening: Cologuard. Completed 09/25/2019. Repeat every 3 years  Mammogram status: No longer required due to age.  Bone Density status: Completed 08/27/2016. Results reflect: Bone density results: OSTEOPOROSIS. Repeat every 2 years.  Lung Cancer Screening: (Low Dose CT Chest recommended if Age 9-80 years, 30 pack-year currently smoking OR have quit w/in 15years.) does not qualify.    Additional Screening:  Hepatitis C Screening: does qualify; Completed 02/09/2013  Vision Screening: Recommended annual ophthalmology exams for early detection of glaucoma and other disorders of the eye. Is the patient up to date with their annual eye exam?  Yes  Who is the provider or what is the name of the office in which the patient attends annual eye exams? [facility] If pt is not established with a provider, would they like to be referred to a provider to establish care? No .   Dental Screening: Recommended annual dental exams for proper oral hygiene  Community Resource Referral / Chronic Care Management: CRR required this visit?  No   CCM required this visit?  No      Plan:     I have personally reviewed and noted the following in the patients chart:   Medical and social history Use of alcohol, tobacco or illicit drugs  Current medications and supplements including opioid prescriptions.  Functional ability and status Nutritional status Physical activity Advanced directives List of other  physicians Hospitalizations, surgeries, and ER visits in previous 12 months Vitals Screenings to include cognitive, depression, and falls Referrals and appointments  In addition, I have reviewed and discussed with patient certain preventive protocols, quality metrics, and best practice recommendations. A written personalized care plan for preventive services as well as general preventive health recommendations were provided to patient.     Darral Dash, LPN   27/74/1287   Nurse Notes: Cologuard up to date. Declined mammogram and bone density. Discussed covid, shingrix and tdap vaccines and how to obtain. Pt does c/o some issues with her BP but has appointment today at 11:30 with provider to discuss per pt.

## 2021-01-27 NOTE — Patient Instructions (Signed)
Vanessa Pitts , Thank you for taking time to come for your Medicare Wellness Visit. I appreciate your ongoing commitment to your health goals. Please review the following plan we discussed and let me know if I can assist you in the future.   Screening recommendations/referrals: Colonoscopy: Cologuard done 09/25/2019, repeat in 3 years. Mammogram: Done 12/03/2015. Repeat annually  Bone Density: Done 08/17/2016 Repeat every 2 years  Recommended yearly ophthalmology/optometry visit for glaucoma screening and checkup Recommended yearly dental visit for hygiene and checkup  Vaccinations: Influenza vaccine: Done 01/13/2021 Repeat annually  Pneumococcal vaccine: Done 03/21/2018 and 06/07/2019 Tdap vaccine: Due Repeat in 10 years  Shingles vaccine: Shingrix discussed. Please contact your pharmacy for coverage information.     Covid-19:Declined.  Advanced directives: Advance directive discussed with you today. Even though you declined this today, please call our office should you change your mind, and we can give you the proper paperwork for you to fill out.   Conditions/risks identified: KEEP UP THE GOOD WORK!! Aim for 30 minutes of exercise each day, drink 6-8 glasses of water and eat lots of fruits and vegetables.   Next appointment: Follow up in one year for your annual wellness visit 2023.    Preventive Care 28 Years and Older, Female Preventive care refers to lifestyle choices and visits with your health care provider that can promote health and wellness. What does preventive care include? A yearly physical exam. This is also called an annual well check. Dental exams once or twice a year. Routine eye exams. Ask your health care provider how often you should have your eyes checked. Personal lifestyle choices, including: Daily care of your teeth and gums. Regular physical activity. Eating a healthy diet. Avoiding tobacco and drug use. Limiting alcohol use. Practicing safe sex. Taking  low-dose aspirin every day. Taking vitamin and mineral supplements as recommended by your health care provider. What happens during an annual well check? The services and screenings done by your health care provider during your annual well check will depend on your age, overall health, lifestyle risk factors, and family history of disease. Counseling  Your health care provider may ask you questions about your: Alcohol use. Tobacco use. Drug use. Emotional well-being. Home and relationship well-being. Sexual activity. Eating habits. History of falls. Memory and ability to understand (cognition). Work and work Statistician. Reproductive health. Screening  You may have the following tests or measurements: Height, weight, and BMI. Blood pressure. Lipid and cholesterol levels. These may be checked every 5 years, or more frequently if you are over 69 years old. Skin check. Lung cancer screening. You may have this screening every year starting at age 54 if you have a 30-pack-year history of smoking and currently smoke or have quit within the past 15 years. Fecal occult blood test (FOBT) of the stool. You may have this test every year starting at age 60. Flexible sigmoidoscopy or colonoscopy. You may have a sigmoidoscopy every 5 years or a colonoscopy every 10 years starting at age 5. Hepatitis C blood test. Hepatitis B blood test. Sexually transmitted disease (STD) testing. Diabetes screening. This is done by checking your blood sugar (glucose) after you have not eaten for a while (fasting). You may have this done every 1-3 years. Bone density scan. This is done to screen for osteoporosis. You may have this done starting at age 8. Mammogram. This may be done every 1-2 years. Talk to your health care provider about how often you should have regular mammograms. Talk with  your health care provider about your test results, treatment options, and if necessary, the need for more tests. Vaccines   Your health care provider may recommend certain vaccines, such as: Influenza vaccine. This is recommended every year. Tetanus, diphtheria, and acellular pertussis (Tdap, Td) vaccine. You may need a Td booster every 10 years. Zoster vaccine. You may need this after age 54. Pneumococcal 13-valent conjugate (PCV13) vaccine. One dose is recommended after age 55. Pneumococcal polysaccharide (PPSV23) vaccine. One dose is recommended after age 72. Talk to your health care provider about which screenings and vaccines you need and how often you need them. This information is not intended to replace advice given to you by your health care provider. Make sure you discuss any questions you have with your health care provider. Document Released: 02/22/2015 Document Revised: 10/16/2015 Document Reviewed: 11/27/2014 Elsevier Interactive Patient Education  2017 [employer].  Fall Prevention in the Home Falls can cause injuries. They can happen to people of all ages. There are many things you can do to make your home safe and to help prevent falls. What can I do on the outside of my home? Regularly fix the edges of walkways and driveways and fix any cracks. Remove anything that might make you trip as you walk through a door, such as a raised step or threshold. Trim any bushes or trees on the path to your home. Use bright outdoor lighting. Clear any walking paths of anything that might make someone trip, such as rocks or tools. Regularly check to see if handrails are loose or broken. Make sure that both sides of any steps have handrails. Any raised decks and porches should have guardrails on the edges. Have any leaves, snow, or ice cleared regularly. Use sand or salt on walking paths during winter. Clean up any spills in your garage right away. This includes oil or grease spills. What can I do in the bathroom? Use night lights. Install grab bars by the toilet and in the tub and shower. Do not use towel  bars as grab bars. Use non-skid mats or decals in the tub or shower. If you need to sit down in the shower, use a plastic, non-slip stool. Keep the floor dry. Clean up any water that spills on the floor as soon as it happens. Remove soap buildup in the tub or shower regularly. Attach bath mats securely with double-sided non-slip rug tape. Do not have throw rugs and other things on the floor that can make you trip. What can I do in the bedroom? Use night lights. Make sure that you have a light by your bed that is easy to reach. Do not use any sheets or blankets that are too big for your bed. They should not hang down onto the floor. Have a firm chair that has side arms. You can use this for support while you get dressed. Do not have throw rugs and other things on the floor that can make you trip. What can I do in the kitchen? Clean up any spills right away. Avoid walking on wet floors. Keep items that you use a lot in easy-to-reach places. If you need to reach something above you, use a strong step stool that has a grab bar. Keep electrical cords out of the way. Do not use floor polish or wax that makes floors slippery. If you must use wax, use non-skid floor wax. Do not have throw rugs and other things on the floor that can make you trip.  What can I do with my stairs? Do not leave any items on the stairs. Make sure that there are handrails on both sides of the stairs and use them. Fix handrails that are broken or loose. Make sure that handrails are as long as the stairways. Check any carpeting to make sure that it is firmly attached to the stairs. Fix any carpet that is loose or worn. Avoid having throw rugs at the top or bottom of the stairs. If you do have throw rugs, attach them to the floor with carpet tape. Make sure that you have a light switch at the top of the stairs and the bottom of the stairs. If you do not have them, ask someone to add them for you. What else can I do to help  prevent falls? Wear shoes that: Do not have high heels. Have rubber bottoms. Are comfortable and fit you well. Are closed at the toe. Do not wear sandals. If you use a stepladder: Make sure that it is fully opened. Do not climb a closed stepladder. Make sure that both sides of the stepladder are locked into place. Ask someone to hold it for you, if possible. Clearly mark and make sure that you can see: Any grab bars or handrails. First and last steps. Where the edge of each step is. Use tools that help you move around (mobility aids) if they are needed. These include: Canes. Walkers. Scooters. Crutches. Turn on the lights when you go into a dark area. Replace any light bulbs as soon as they burn out. Set up your furniture so you have a clear path. Avoid moving your furniture around. If any of your floors are uneven, fix them. If there are any pets around you, be aware of where they are. Review your medicines with your doctor. Some medicines can make you feel dizzy. This can increase your chance of falling. Ask your doctor what other things that you can do to help prevent falls. This information is not intended to replace advice given to you by your health care provider. Make sure you discuss any questions you have with your health care provider. Document Released: 11/22/2008 Document Revised: 07/04/2015 Document Reviewed: 03/02/2014 Elsevier Interactive Patient Education  2017 [employer].

## 2021-02-07 ENCOUNTER — Telehealth: Payer: Self-pay | Admitting: Family Medicine

## 2021-02-07 DIAGNOSIS — N1831 Chronic kidney disease, stage 3a: Secondary | ICD-10-CM

## 2021-02-07 DIAGNOSIS — I10 Essential (primary) hypertension: Secondary | ICD-10-CM

## 2021-02-07 MED ORDER — TELMISARTAN 20 MG PO TABS: 20.0000 mg | ORAL_TABLET | Freq: Every day | ORAL | 3 refills | Status: DC

## 2021-02-07 NOTE — Telephone Encounter (Signed)
Have her start taking 1.5 tablets daily and monitor BPs for next week.  Call me with BP readings next week.

## 2021-02-07 NOTE — Telephone Encounter (Signed)
Pt aware of recommendations and refill has been sent to pharmacy

## 2021-02-14 ENCOUNTER — Other Ambulatory Visit: Payer: Self-pay | Admitting: *Deleted

## 2021-02-17 ENCOUNTER — Telehealth: Payer: Self-pay | Admitting: Family Medicine

## 2021-02-17 MED ORDER — ALENDRONATE SODIUM 70 MG PO TABS: ORAL_TABLET | ORAL | 3 refills | Status: DC

## 2021-02-17 NOTE — Telephone Encounter (Signed)
Pt calling in bp readings for [facility]. Bp medication was changed last week. AM BP readings--hour after taking medication 02/17/2021 148/82 02/16/2021 184/93 02/15/2021 154/81 02/14/2021 forgot to take 02/13/2021 153/97  Pt says that she does not see change in numbers.

## 2021-02-17 NOTE — Telephone Encounter (Signed)
It takes about 2 weeks before significant BP changes can be observed with ACEI and ARB.  Have her come in for a BP check with nurse.  Will also need repeat BMP given recent increase in the Telmisartan.  If still elevated at that visit, will consider advancing dose further.

## 2021-02-18 NOTE — Telephone Encounter (Signed)
Pt is aware of provider feedback and scheduled with the triage nurse 02/25/2021 at 2:30.

## 2021-02-20 ENCOUNTER — Other Ambulatory Visit: Payer: Self-pay | Admitting: Family Medicine

## 2021-02-25 ENCOUNTER — Other Ambulatory Visit: Payer: Self-pay | Admitting: Family Medicine

## 2021-02-25 ENCOUNTER — Ambulatory Visit: Payer: Medicare Other

## 2021-02-25 DIAGNOSIS — I10 Essential (primary) hypertension: Secondary | ICD-10-CM

## 2021-02-25 DIAGNOSIS — R790 Abnormal level of blood mineral: Secondary | ICD-10-CM

## 2021-02-25 DIAGNOSIS — N1831 Chronic kidney disease, stage 3a: Secondary | ICD-10-CM

## 2021-02-25 MED ORDER — AMLODIPINE BESYLATE 5 MG PO TABS: 5.0000 mg | ORAL_TABLET | Freq: Every day | ORAL | 3 refills | Status: DC

## 2021-02-25 NOTE — Progress Notes (Signed)
Patient here today for blood pressure check.  Reading is 185/100, pulse 91.  Patient also brought in blood pressure readings taken at home:  02/09/21  160/85 02/10/21  144/98 02/11/21  144/40 02/12/21  136/74 02/13/21  163/93 02/14/21  153/97 02/15/21  154/81 02/16/21  184/93 02/17/21  148/82 02/18/21 172/87 02/19/21 141/78 02/20/21 158/91 02/21/21 182/88 02/22/21 143/77 02/23/21 136/86 02/24/21 140/80 02/25/21 174/87

## 2021-02-26 LAB — BMP8+EGFR
BUN/Creatinine Ratio: 21 (ref 12–28)
BUN: 20 mg/dL (ref 8–27)
CO2: 22 mmol/L (ref 20–29)
Calcium: 9.6 mg/dL (ref 8.7–10.3)
Chloride: 105 mmol/L (ref 96–106)
Creatinine, Ser: 0.96 mg/dL (ref 0.57–1.00)
Glucose: 98 mg/dL (ref 70–99)
Potassium: 5 mmol/L (ref 3.5–5.2)
Sodium: 143 mmol/L (ref 134–144)
eGFR: 62 mL/min/{1.73_m2} (ref >59)

## 2021-02-28 ENCOUNTER — Ambulatory Visit
Admission: RE | Admit: 2021-02-28 | Discharge: 2021-02-28 | Disposition: A | Discharge status: Home / Self Care | Payer: Medicare Other | Source: Ambulatory Visit | Attending: Family Medicine | Admitting: Family Medicine

## 2021-02-28 ENCOUNTER — Other Ambulatory Visit: Payer: Self-pay

## 2021-02-28 DIAGNOSIS — M4307 Spondylolysis, lumbosacral region: Secondary | ICD-10-CM

## 2021-02-28 MED ORDER — METHYLPREDNISOLONE ACETATE 40 MG/ML INJ SUSP (RADIOLOG
80.0000 mg | Freq: Once | INTRAMUSCULAR | Status: AC
  Administered 2021-02-28: 80 mg via EPIDURAL

## 2021-02-28 MED ORDER — IOPAMIDOL (ISOVUE-M 200) INJECTION 41%
1.0000 mL | Freq: Once | INTRAMUSCULAR | Status: AC
  Administered 2021-02-28: 1 mL via EPIDURAL

## 2021-02-28 NOTE — Discharge Instructions (Signed)

## 2021-04-17 ENCOUNTER — Other Ambulatory Visit: Payer: Self-pay | Admitting: Family Medicine

## 2021-07-15 ENCOUNTER — Other Ambulatory Visit: Payer: Self-pay | Admitting: Family Medicine

## 2021-07-15 DIAGNOSIS — M62838 Other muscle spasm: Secondary | ICD-10-CM

## 2021-08-19 ENCOUNTER — Other Ambulatory Visit: Payer: Self-pay | Admitting: Family Medicine

## 2021-09-22 ENCOUNTER — Telehealth: Payer: Self-pay | Admitting: Family Medicine

## 2021-09-22 NOTE — Telephone Encounter (Signed)
Order given to Alyssa to fax.  Please scan copy of rx into chart.

## 2021-09-22 NOTE — Telephone Encounter (Signed)
Pt aware order faxed.

## 2021-09-23 ENCOUNTER — Other Ambulatory Visit: Payer: Self-pay | Admitting: Family Medicine

## 2021-09-23 DIAGNOSIS — M4307 Spondylolysis, lumbosacral region: Secondary | ICD-10-CM

## 2021-09-26 ENCOUNTER — Other Ambulatory Visit: Payer: Medicare Other

## 2021-10-02 ENCOUNTER — Ambulatory Visit
Admission: RE | Admit: 2021-10-02 | Discharge: 2021-10-02 | Disposition: A | Discharge status: Home / Self Care | Payer: Medicare Other | Source: Ambulatory Visit | Attending: Family Medicine | Admitting: Family Medicine

## 2021-10-02 DIAGNOSIS — M4307 Spondylolysis, lumbosacral region: Secondary | ICD-10-CM

## 2021-10-02 MED ORDER — IOPAMIDOL (ISOVUE-M 200) INJECTION 41%
1.0000 mL | Freq: Once | INTRAMUSCULAR | Status: AC
  Administered 2021-10-02: 1 mL via EPIDURAL

## 2021-10-02 MED ORDER — METHYLPREDNISOLONE ACETATE 40 MG/ML INJ SUSP (RADIOLOG
80.0000 mg | Freq: Once | INTRAMUSCULAR | Status: AC
  Administered 2021-10-02: 80 mg via EPIDURAL

## 2021-10-02 NOTE — Discharge Instructions (Signed)

## 2021-11-25 ENCOUNTER — Ambulatory Visit (INDEPENDENT_AMBULATORY_CARE_PROVIDER_SITE_OTHER): Payer: Medicare Other | Admitting: Family Medicine

## 2021-11-25 ENCOUNTER — Other Ambulatory Visit: Payer: Self-pay | Admitting: Family Medicine

## 2021-11-25 ENCOUNTER — Encounter: Payer: Self-pay | Admitting: Family Medicine

## 2021-11-25 VITALS — BP 167/81 | HR 95 | Temp 98.3°F | Ht 67.0 in | Wt 157.0 lb

## 2021-11-25 DIAGNOSIS — N1831 Chronic kidney disease, stage 3a: Secondary | ICD-10-CM

## 2021-11-25 DIAGNOSIS — Z23 Encounter for immunization: Secondary | ICD-10-CM

## 2021-11-25 DIAGNOSIS — R450 Nervousness: Secondary | ICD-10-CM

## 2021-11-25 DIAGNOSIS — J3089 Other allergic rhinitis: Secondary | ICD-10-CM | POA: Diagnosis not present

## 2021-11-25 DIAGNOSIS — E785 Hyperlipidemia, unspecified: Secondary | ICD-10-CM | POA: Diagnosis not present

## 2021-11-25 DIAGNOSIS — I10 Essential (primary) hypertension: Secondary | ICD-10-CM | POA: Diagnosis not present

## 2021-11-25 MED ORDER — TELMISARTAN 20 MG PO TABS: 20.0000 mg | ORAL_TABLET | Freq: Every day | ORAL | 3 refills | Status: DC

## 2021-11-25 MED ORDER — FLUTICASONE PROPIONATE 50 MCG/ACT NA SUSP: 1.0000 | Freq: Two times a day (BID) | NASAL | 6 refills | Status: DC | PRN

## 2021-11-25 MED ORDER — AMLODIPINE BESYLATE 5 MG PO TABS: 5.0000 mg | ORAL_TABLET | Freq: Every day | ORAL | 3 refills | Status: DC

## 2021-11-25 MED ORDER — MONTELUKAST SODIUM 10 MG PO TABS: 10.0000 mg | ORAL_TABLET | Freq: Every day | ORAL | 3 refills | Status: DC

## 2021-11-25 MED ORDER — PRAVASTATIN SODIUM 20 MG PO TABS
20.0000 mg | ORAL_TABLET | Freq: Every day | ORAL | 3 refills | Status: DC
Start: 2021-11-25 — End: 2023-01-04

## 2021-11-25 NOTE — Patient Instructions (Signed)
Singulair added for allergy. Take at bedtime Goal blood pressure less than 150/90.

## 2021-11-25 NOTE — Progress Notes (Signed)
Subjective: CC: Follow-up hypertension, CKD PCP: Janora Norlander, DO ZOX:WRUE Staub is a 76 y.o. female presenting to clinic today for:  1.  CKD 3 associated with hypertension and hyperlipidemia Patient reports compliance with Micardis 40 mg daily.  She has not been on her amlodipine due to borderline blood pressures previously.  Denies any chest pain, shortness of breath, visual disturbance or edema  2.  Allergy symptoms Patient reports 7 to 87-monthhistory of uncontrolled allergy symptoms despite multiple modalities of treatment including Zyrtec, Xyzal, Claritin and Allegra.  She uses her Flonase but still wakes up every morning with sinus pressure and fullness.  It does not until later in the afternoon that she even starts feeling any relief from this.  No fevers or purulence.  3.  Nervousness Patient reports sometimes she feels nervous.  Cannot pinpoint what causes it.  She does admit to drinking coffee in the morning and then again in the afternoon.  Otherwise denies any other caffeine intake.  No heart palpitations reported  ROS: Per HPI  No Known Allergies Past Medical History:  Diagnosis Date   Chronically dry eyes    Hyperlipidemia    Hypertension    Leg pain, left    Osteoporosis    Ovarian cyst     Current Outpatient Medications:    alendronate (FOSAMAX) 70 MG tablet, TAKE 1 TABLET BY MOUTH ONCE A WEEK WITH A FULL GLASS OF WATER ON AN EMPTY STOMACH, Disp: 12 tablet, Rfl: 3   amLODipine (NORVASC) 5 MG tablet, Take 1 tablet (5 mg total) by mouth daily. For blood pressure, Disp: 90 tablet, Rfl: 3   fluticasone (FLONASE) 50 MCG/ACT nasal spray, Place 1 spray into both nostrils 2 (two) times daily as needed for allergies or rhinitis., Disp: 16 g, Rfl: 6   pantoprazole (PROTONIX) 20 MG tablet, Take 1 tablet by mouth once daily, Disp: 90 tablet, Rfl: 3   pravastatin (PRAVACHOL) 20 MG tablet, Take 1 tablet by mouth once daily, Disp: 90 tablet, Rfl: 0   telmisartan  (MICARDIS) 20 MG tablet, Take 1 tablet (20 mg total) by mouth daily., Disp: 90 tablet, Rfl: 3   tiZANidine (ZANAFLEX) 4 MG tablet, TAKE 1/2 TO 1 (ONE-HALF TO ONE) TABLET BY MOUTH EVERY 8 HOURS AS NEEDED FOR MUSCLE SPASM, Disp: 30 tablet, Rfl: 12   traMADol (ULTRAM) 50 MG tablet, Take 1 tablet (50 mg total) by mouth every 12 (twelve) hours as needed for moderate pain or severe pain., Disp: 60 tablet, Rfl: 1   triamcinolone cream (KENALOG) 0.1 %, Apply scant amount to finger and rub along ear canal twice daily for 1 week as needed for itching/ dermatitis, Disp: 30 g, Rfl: 0   vitamin B-12 (CYANOCOBALAMIN) 500 MCG tablet, 500 mcg., Disp: , Rfl:  Social History   Socioeconomic History   Marital status: Widowed    Spouse name: MRosaria Ferries  Number of children: 3   Years of education: 13   Highest education level: Some college, no degree  Occupational History   Occupation: retired  Tobacco Use   Smoking status: Never   Smokeless tobacco: Never  Substance and Sexual Activity   Alcohol use: No   Drug use: No   Sexual activity: Yes    Birth control/protection: Post-menopausal  Other Topics Concern   Not on file  Social History Narrative   Husband passed away in 2Feb 23, 2021   Pt lives with daughter.1 son and 2 daughters.    Social Determinants of Health  Financial Resource Strain: Low Risk  (01/27/2021)   Overall Financial Resource Strain (CARDIA)    Difficulty of Paying Living Expenses: Not hard at all  Food Insecurity: No Food Insecurity (01/27/2021)   Hunger Vital Sign    Worried About Running Out of Food in the Last Year: Never true    Ran Out of Food in the Last Year: Never true  Transportation Needs: No Transportation Needs (01/27/2021)   PRAPARE - Hydrologist (Medical): No    Lack of Transportation (Non-Medical): No  Physical Activity: Insufficiently Active (01/27/2021)   Exercise Vital Sign    Days of Exercise per Week: 5 days    Minutes of Exercise per  Session: 20 min  Stress: No Stress Concern Present (01/27/2021)   [place]    Feeling of Stress : Not at all  Social Connections: Socially Isolated (01/27/2021)   Social Connection and Isolation Panel [NHANES]    Frequency of Communication with Friends and Family: More than three times a week    Frequency of Social Gatherings with Friends and Family: More than three times a week    Attends Religious Services: Never    Marine scientist or Organizations: No    Attends Archivist Meetings: Never    Marital Status: Widowed  Intimate Partner Violence: Not At Risk (01/27/2021)   Humiliation, Afraid, Rape, and Kick questionnaire    Fear of Current or Ex-Partner: No    Emotionally Abused: No    Physically Abused: No    Sexually Abused: No   Family History  Problem Relation Age of Onset   Heart disease Mother    Diabetes Mother    Hyperlipidemia Mother    Hypertension Mother    Arthritis Mother    Obesity Mother    Arthritis Father     Objective: Office vital signs reviewed. BP (!) 167/81   Pulse 95   Temp 98.3 F (36.8 C)   Ht _0  (1.702 m)   Wt 157 lb (71.2 kg)   SpO2 97%   BMI 24.59 kg/m   Physical Examination:  General: Awake, alert, well nourished, No acute distress HEENT: sclera white, MMM Cardio: regular rate and rhythm, S1S2 heard, no murmurs appreciated Pulm: clear to auscultation bilaterally, no wheezes, rhonchi or rales; normal work of breathing on room air MSK: normal gait and station Skin: dry; intact; no rashes or lesions Neuro: no tremor Psych: mood stable, speech normal. Affect appropriate  Assessment/ Plan: 76 y.o. female   Non-seasonal allergic rhinitis, unspecified trigger - Plan: fluticasone (FLONASE) 50 MCG/ACT nasal spray, montelukast (SINGULAIR) 10 MG tablet  Stage 3a chronic kidney disease (HCC) - Plan: telmisartan (MICARDIS) 20 MG tablet,  CMP14+EGFR  Essential hypertension - Plan: amLODipine (NORVASC) 5 MG tablet, telmisartan (MICARDIS) 20 MG tablet, CMP14+EGFR  Need for immunization against influenza - Plan: Flu Vaccine QUAD High Dose(Fluad)  Hyperlipidemia LDL goal <130 - Plan: LDL Cholesterol, Direct  Nervousness - Plan: TSH, T4, Free  Flonase nasal spray renewed.  Add Singulair given uncontrolled allergy symptoms which are refractory to over-the-counter allergy medicines.  Renewed telmisartan.  Check renal function, electrolytes  Blood pressure remains uncontrolled upon recheck so would like her to resume use of amlodipine.  This has been sent  Influenza vaccination administered  Given nonfasting status, LDL ordered  She reports some nervousness which may be surrounding the use of caffeine.  Advised to discontinue afternoon use of caffeine  but will check for thyroid abnormality  No orders of the defined types were placed in this encounter.  No orders of the defined types were placed in this encounter.    Janora Norlander, DO [place] [phone]

## 2021-11-26 LAB — CMP14+EGFR
ALT: 16 IU/L (ref 0–32)
AST: 24 IU/L (ref 0–40)
Albumin/Globulin Ratio: 1.9 (ref 1.2–2.2)
Albumin: 4.4 g/dL (ref 3.8–4.8)
Alkaline Phosphatase: 61 IU/L (ref 44–121)
BUN/Creatinine Ratio: 22 (ref 12–28)
BUN: 22 mg/dL (ref 8–27)
Bilirubin Total: <0.2 mg/dL (ref 0.0–1.2)
CO2: 23 mmol/L (ref 20–29)
Calcium: 9.4 mg/dL (ref 8.7–10.3)
Chloride: 103 mmol/L (ref 96–106)
Creatinine, Ser: 1.01 mg/dL — ABNORMAL HIGH (ref 0.57–1.00)
Globulin, Total: 2.3 g/dL (ref 1.5–4.5)
Glucose: 101 mg/dL — ABNORMAL HIGH (ref 70–99)
Potassium: 4 mmol/L (ref 3.5–5.2)
Sodium: 142 mmol/L (ref 134–144)
Total Protein: 6.7 g/dL (ref 6.0–8.5)
eGFR: 58 mL/min/{1.73_m2} — ABNORMAL LOW (ref >59)

## 2021-11-26 LAB — TSH: TSH: 2.27 u[IU]/mL (ref 0.450–4.500)

## 2021-11-26 LAB — T4, FREE: Free T4: 1.39 ng/dL (ref 0.82–1.77)

## 2021-11-26 LAB — LDL CHOLESTEROL, DIRECT: LDL Direct: 83 mg/dL (ref 0–99)

## 2021-12-09 ENCOUNTER — Telehealth: Payer: Self-pay | Admitting: Family Medicine

## 2021-12-09 NOTE — Telephone Encounter (Signed)
NA/NVM Pt has a controlled substance contract. OV on 11/25/21 had no Dx to associate for RF of this medication and last RF for this medication was in 2021. Pt will NTBS in order to receive RF.

## 2021-12-09 NOTE — Telephone Encounter (Signed)
  Prescription Request  12/09/2021  Is this a "Controlled Substance" medicine? yes  Have you seen your PCP in the last 2 weeks? 11/25/21   If YES, route message to pool  -  If NO, patient needs to be scheduled for appointment.  What is the name of the medication or equipment? tramadol  Have you contacted your pharmacy to request a refill? No pt forgot to ask dr g for it at last visit   Which pharmacy would you like this sent to? [facility] [place]   Patient notified that their request is being sent to the clinical staff for review and that they should receive a response within 2 business days.

## 2021-12-12 NOTE — Telephone Encounter (Signed)
Closing encounter

## 2022-01-05 ENCOUNTER — Telehealth: Payer: Self-pay | Admitting: Family Medicine

## 2022-01-05 NOTE — Telephone Encounter (Signed)
Patient needs information sent to Dr Mosetta Putt at [facility]. Said Dr Reece Agar would know what she was talking about

## 2022-01-05 NOTE — Telephone Encounter (Signed)
Faxed and pt aware

## 2022-01-05 NOTE — Telephone Encounter (Signed)
done

## 2022-01-07 ENCOUNTER — Other Ambulatory Visit: Payer: Self-pay | Admitting: Family Medicine

## 2022-01-07 DIAGNOSIS — M47817 Spondylosis without myelopathy or radiculopathy, lumbosacral region: Secondary | ICD-10-CM

## 2022-01-14 ENCOUNTER — Ambulatory Visit
Admission: RE | Admit: 2022-01-14 | Discharge: 2022-01-14 | Disposition: A | Discharge status: Home / Self Care | Payer: Medicare Other | Source: Ambulatory Visit | Attending: Family Medicine | Admitting: Family Medicine

## 2022-01-14 DIAGNOSIS — M47817 Spondylosis without myelopathy or radiculopathy, lumbosacral region: Secondary | ICD-10-CM

## 2022-01-14 MED ORDER — METHYLPREDNISOLONE ACETATE 40 MG/ML INJ SUSP (RADIOLOG
80.0000 mg | Freq: Once | INTRAMUSCULAR | Status: AC
  Administered 2022-01-14: 80 mg via EPIDURAL

## 2022-01-14 MED ORDER — IOPAMIDOL (ISOVUE-M 200) INJECTION 41%
1.0000 mL | Freq: Once | INTRAMUSCULAR | Status: AC
  Administered 2022-01-14: 1 mL via EPIDURAL

## 2022-01-14 NOTE — Discharge Instructions (Signed)

## 2022-02-25 ENCOUNTER — Ambulatory Visit: Payer: Medicare Other | Admitting: Family Medicine

## 2022-03-04 ENCOUNTER — Other Ambulatory Visit: Payer: Self-pay | Admitting: *Deleted

## 2022-03-04 MED ORDER — ALENDRONATE SODIUM 70 MG PO TABS: ORAL_TABLET | ORAL | 0 refills | Status: DC

## 2022-03-31 ENCOUNTER — Ambulatory Visit (INDEPENDENT_AMBULATORY_CARE_PROVIDER_SITE_OTHER): Payer: Medicare Other

## 2022-03-31 VITALS — Ht 67.0 in | Wt 157.0 lb

## 2022-03-31 DIAGNOSIS — Z780 Asymptomatic menopausal state: Secondary | ICD-10-CM

## 2022-03-31 DIAGNOSIS — Z0000 Encounter for general adult medical examination without abnormal findings: Secondary | ICD-10-CM

## 2022-03-31 NOTE — Progress Notes (Signed)
Subjective:   Vanessa Pitts is a 77 y.o. female who presents for Medicare Annual (Subsequent) preventive examination. I connected with  Vanessa Pitts on 03/31/22 by a audio enabled telemedicine application and verified that I am speaking with the correct person using two identifiers.  Patient Location: Home  Provider Location: Home Office  I discussed the limitations of evaluation and management by telemedicine. The patient expressed understanding and agreed to proceed.  Review of Systems     Cardiac Risk Factors include: advanced age (>48mn, >>56women);hypertension     Objective:    Today's Vitals   03/31/22 1037  Weight: 157 lb (71.2 kg)  Height: 5' 7"$  (1.702 m)   Body mass index is 24.59 kg/m.     03/31/2022   10:40 AM 01/27/2021   10:51 AM 09/13/2018   10:16 AM  Advanced Directives  Does Patient Have a Medical Advance Directive? No No No  Would patient like information on creating a medical advance directive? No - Patient declined No - Patient declined No - Patient declined    Current Medications (verified) Outpatient Encounter Medications as of 03/31/2022  Medication Sig   alendronate (FOSAMAX) 70 MG tablet TAKE 1 TABLET BY MOUTH ONCE A WEEK WITH A FULL GLASS OF WATER ON AN EMPTY STOMACH   amLODipine (NORVASC) 5 MG tablet Take 1 tablet (5 mg total) by mouth daily. For blood pressure   fluticasone (FLONASE) 50 MCG/ACT nasal spray Place 1 spray into both nostrils 2 (two) times daily as needed for allergies or rhinitis.   montelukast (SINGULAIR) 10 MG tablet Take 1 tablet (10 mg total) by mouth at bedtime. For allergies   pantoprazole (PROTONIX) 20 MG tablet Take 1 tablet by mouth once daily   pravastatin (PRAVACHOL) 20 MG tablet Take 1 tablet (20 mg total) by mouth daily.   telmisartan (MICARDIS) 20 MG tablet Take 1 tablet (20 mg total) by mouth daily.   tiZANidine (ZANAFLEX) 4 MG tablet TAKE 1/2 TO 1 (ONE-HALF TO ONE) TABLET BY MOUTH EVERY 8 HOURS AS NEEDED FOR MUSCLE  SPASM   traMADol (ULTRAM) 50 MG tablet Take 1 tablet (50 mg total) by mouth every 12 (twelve) hours as needed for moderate pain or severe pain.   triamcinolone cream (KENALOG) 0.1 % Apply scant amount to finger and rub along ear canal twice daily for 1 week as needed for itching/ dermatitis   vitamin B-12 (CYANOCOBALAMIN) 500 MCG tablet 500 mcg.   No facility-administered encounter medications on file as of 03/31/2022.    Allergies (verified) Patient has no known allergies.   History: Past Medical History:  Diagnosis Date   Chronically dry eyes    Hyperlipidemia    Hypertension    Leg pain, left    Osteoporosis    Ovarian cyst    Past Surgical History:  Procedure Laterality Date   cataract Bilateral 2017   Family History  Problem Relation Age of Onset   Heart disease Mother    Diabetes Mother    Hyperlipidemia Mother    Hypertension Mother    Arthritis Mother    Obesity Mother    Arthritis Father    Social History   Socioeconomic History   Marital status: Widowed    Spouse name: MRosaria Ferries  Number of children: 3   Years of education: 13   Highest education level: Some college, no degree  Occupational History   Occupation: retired  Tobacco Use   Smoking status: Never   Smokeless tobacco: Never  Substance  and Sexual Activity   Alcohol use: No   Drug use: No   Sexual activity: Yes    Birth control/protection: Post-menopausal  Other Topics Concern   Not on file  Social History Narrative   Husband passed away in 24-Apr-2019.   Pt lives with daughter.1 son and 2 daughters.    Social Determinants of Health   Financial Resource Strain: Low Risk  (03/31/2022)   Overall Financial Resource Strain (CARDIA)    Difficulty of Paying Living Expenses: Not hard at all  Food Insecurity: No Food Insecurity (03/31/2022)   Hunger Vital Sign    Worried About Running Out of Food in the Last Year: Never true    Ran Out of Food in the Last Year: Never true  Transportation Needs: No  Transportation Needs (03/31/2022)   PRAPARE - Hydrologist (Medical): No    Lack of Transportation (Non-Medical): No  Physical Activity: Insufficiently Active (03/31/2022)   Exercise Vital Sign    Days of Exercise per Week: 3 days    Minutes of Exercise per Session: 30 min  Stress: No Stress Concern Present (03/31/2022)   [place]    Feeling of Stress : Not at all  Social Connections: Socially Isolated (03/31/2022)   Social Connection and Isolation Panel [NHANES]    Frequency of Communication with Friends and Family: More than three times a week    Frequency of Social Gatherings with Friends and Family: More than three times a week    Attends Religious Services: Never    Marine scientist or Organizations: No    Attends Archivist Meetings: Never    Marital Status: Widowed    Tobacco Counseling Counseling given: Not Answered   Clinical Intake:  Pre-visit preparation completed: Yes  Pain : No/denies pain     Nutritional Risks: None Diabetes: No  How often do you need to have someone help you when you read instructions, pamphlets, or other written materials from your doctor or pharmacy?: 1 - Never  Diabetic?no   Interpreter Needed?: No  Information entered by :: Jadene Pierini, LPN   Activities of Daily Living    03/31/2022   10:40 AM  In your present state of health, do you have any difficulty performing the following activities:  Hearing? 0  Vision? 0  Difficulty concentrating or making decisions? 0  Walking or climbing stairs? 0  Dressing or bathing? 0  Doing errands, shopping? 0  Preparing Food and eating ? N  Using the Toilet? N  In the past six months, have you accidently leaked urine? N  Do you have problems with loss of bowel control? N  Managing your Medications? N  Managing your Finances? N  Housekeeping or managing your Housekeeping? N     Patient Care Team: Janora Norlander, DO as PCP - General (Family Medicine)  Indicate any recent Medical Services you may have received from other than [facility] providers in the past year (date may be approximate).     Assessment:   This is a routine wellness examination for Vanessa Pitts.  Hearing/Vision screen Vision Screening - Comments:: Wears rx glasses - up to date with routine eye exams with  Dr. Kalman Shan  Dietary issues and exercise activities discussed: Current Exercise Habits: Home exercise routine, Type of exercise: stretching, Time (Minutes): 30, Frequency (Times/Week): 3, Weekly Exercise (Minutes/Week): 90, Intensity: Mild, Exercise limited by: orthopedic condition(s)   Goals Addressed  This Visit's Progress    DIET - INCREASE WATER INTAKE   On track    Try to drink 6-8 glasses of water daily.       Depression Screen    03/31/2022   10:39 AM 11/25/2021    1:58 PM 01/27/2021   10:45 AM 01/17/2021   10:26 AM 02/21/2020   11:01 AM 09/06/2019    1:10 PM 06/07/2019   10:33 AM  PHQ 2/9 Scores  PHQ - 2 Score 0 0 0 1 2 0 0  PHQ- 9 Score    3 6 0 0    Fall Risk    03/31/2022   10:38 AM 11/25/2021    1:58 PM 01/27/2021   10:51 AM 01/17/2021   10:26 AM 02/21/2020   11:01 AM  Fall Risk   Falls in the past year? 0 0 0 0 0  Number falls in past yr: 0  0    Injury with Fall? 0  0    Risk for fall due to : No Fall Risks  No Fall Risks    Follow up Falls prevention discussed  Falls prevention discussed      FALL RISK PREVENTION PERTAINING TO THE HOME:  Any stairs in or around the home? No  If so, are there any without handrails? No  Home free of loose throw rugs in walkways, pet beds, electrical cords, etc? Yes  Adequate lighting in your home to reduce risk of falls? Yes   ASSISTIVE DEVICES UTILIZED TO PREVENT FALLS:  Life alert? No  Use of a cane, walker or w/c? No  Grab bars in the bathroom? No  Shower chair or bench in shower? Yes  Elevated toilet  seat or a handicapped toilet? Yes        03/31/2022   10:41 AM 01/27/2021   10:55 AM 09/13/2018   10:19 AM  6CIT Screen  What Year? 0 points 0 points 0 points  What month? 0 points 0 points 0 points  What time? 0 points 0 points 0 points  Count back from 20 0 points 0 points 0 points  Months in reverse 0 points 0 points 0 points  Repeat phrase 0 points 4 points 2 points  Total Score 0 points 4 points 2 points    Immunizations Immunization History  Administered Date(s) Administered   Fluad Quad(high Dose 65+) 11/11/2018, 02/21/2020, 01/13/2021, 11/25/2021   Influenza, High Dose Seasonal PF 11/22/2017   Pneumococcal Conjugate-13 03/21/2018   Pneumococcal Polysaccharide-23 06/07/2019    TDAP status: Due, Education has been provided regarding the importance of this vaccine. Advised may receive this vaccine at local pharmacy or Health Dept. Aware to provide a copy of the vaccination record if obtained from local pharmacy or Health Dept. Verbalized acceptance and understanding.  Flu Vaccine status: Up to date  Pneumococcal vaccine status: Up to date  Covid-19 vaccine status: Completed vaccines  Qualifies for Shingles Vaccine? Yes   Zostavax completed No   Shingrix Completed?: No.    Education has been provided regarding the importance of this vaccine. Patient has been advised to call insurance company to determine out of pocket expense if they have not yet received this vaccine. Advised may also receive vaccine at local pharmacy or Health Dept. Verbalized acceptance and understanding.  Screening Tests Health Maintenance  Topic Date Due   DTaP/Tdap/Td (1 - Tdap) Never done   Zoster Vaccines- Shingrix (1 of 2) Never done   Medicare Annual Wellness (AWV)  04/01/2023   Pneumonia  Vaccine 66+ Years old  Completed   INFLUENZA VACCINE  Completed   DEXA SCAN  Completed   Hepatitis C Screening  Completed   HPV VACCINES  Aged Out   COVID-19 Vaccine  Discontinued    Health  Maintenance  Health Maintenance Due  Topic Date Due   DTaP/Tdap/Td (1 - Tdap) Never done   Zoster Vaccines- Shingrix (1 of 2) Never done    Colorectal cancer screening: No longer required.   Mammogram status: No longer required due to age.  Bone Density status: Ordered 03/31/2022. Pt provided with contact info and advised to call to schedule appt.  Lung Cancer Screening: (Low Dose CT Chest recommended if Age 17-80 years, 30 pack-year currently smoking OR have quit w/in 15years.) does not qualify.   Lung Cancer Screening Referral: n/a  Additional Screening:  Hepatitis C Screening: does not qualify;  Vision Screening: Recommended annual ophthalmology exams for early detection of glaucoma and other disorders of the eye. Is the patient up to date with their annual eye exam?  Yes  Who is the provider or what is the name of the office in which the patient attends annual eye exams? Dr.Burton  If pt is not established with a provider, would they like to be referred to a provider to establish care? No .   Dental Screening: Recommended annual dental exams for proper oral hygiene  Community Resource Referral / Chronic Care Management: CRR required this visit?  No   CCM required this visit?  No      Plan:     I have personally reviewed and noted the following in the patient's chart:   Medical and social history Use of alcohol, tobacco or illicit drugs  Current medications and supplements including opioid prescriptions. Patient is not currently taking opioid prescriptions. Functional ability and status Nutritional status Physical activity Advanced directives List of other physicians Hospitalizations, surgeries, and ER visits in previous 12 months Vitals Screenings to include cognitive, depression, and falls Referrals and appointments  In addition, I have reviewed and discussed with patient certain preventive protocols, quality metrics, and best practice recommendations. A  written personalized care plan for preventive services as well as general preventive health recommendations were provided to patient.     Daphane Shepherd, LPN   [id number]   Nurse Notes: Due TDAP Vaccine

## 2022-03-31 NOTE — Patient Instructions (Signed)
Ms. Sticka , Thank you for taking time to come for your Medicare Wellness Visit. I appreciate your ongoing commitment to your health goals. Please review the following plan we discussed and let me know if I can assist you in the future.   These are the goals we discussed:  Goals      DIET - INCREASE WATER INTAKE     Try to drink 6-8 glasses of water daily.        This is a list of the screening recommended for you and due dates:  Health Maintenance  Topic Date Due   DTaP/Tdap/Td vaccine (1 - Tdap) Never done   Zoster (Shingles) Vaccine (1 of 2) Never done   Medicare Annual Wellness Visit  04/01/2023   Pneumonia Vaccine  Completed   Flu Shot  Completed   DEXA scan (bone density measurement)  Completed   Hepatitis C Screening: USPSTF Recommendation to screen - Ages 13-79 yo.  Completed   HPV Vaccine  Aged Out   COVID-19 Vaccine  Discontinued    Advanced directives: Advance directive discussed with you today. I have provided a copy for you to complete at home and have notarized. Once this is complete please bring a copy in to our office so we can scan it into your chart.   Conditions/risks identified: Aim for 30 minutes of exercise or brisk walking, 6-8 glasses of water, and 5 servings of fruits and vegetables each day.   Next appointment: Follow up in one year for your annual wellness visit    Preventive Care 65 Years and Older, Female Preventive care refers to lifestyle choices and visits with your health care provider that can promote health and wellness. What does preventive care include? A yearly physical exam. This is also called an annual well check. Dental exams once or twice a year. Routine eye exams. Ask your health care provider how often you should have your eyes checked. Personal lifestyle choices, including: Daily care of your teeth and gums. Regular physical activity. Eating a healthy diet. Avoiding tobacco and drug use. Limiting alcohol use. Practicing safe  sex. Taking low-dose aspirin every day. Taking vitamin and mineral supplements as recommended by your health care provider. What happens during an annual well check? The services and screenings done by your health care provider during your annual well check will depend on your age, overall health, lifestyle risk factors, and family history of disease. Counseling  Your health care provider may ask you questions about your: Alcohol use. Tobacco use. Drug use. Emotional well-being. Home and relationship well-being. Sexual activity. Eating habits. History of falls. Memory and ability to understand (cognition). Work and work Statistician. Reproductive health. Screening  You may have the following tests or measurements: Height, weight, and BMI. Blood pressure. Lipid and cholesterol levels. These may be checked every 5 years, or more frequently if you are over 45 years old. Skin check. Lung cancer screening. You may have this screening every year starting at age 30 if you have a 30-pack-year history of smoking and currently smoke or have quit within the past 15 years. Fecal occult blood test (FOBT) of the stool. You may have this test every year starting at age 70. Flexible sigmoidoscopy or colonoscopy. You may have a sigmoidoscopy every 5 years or a colonoscopy every 10 years starting at age 46. Hepatitis C blood test. Hepatitis B blood test. Sexually transmitted disease (STD) testing. Diabetes screening. This is done by checking your blood sugar (glucose) after you have not  eaten for a while (fasting). You may have this done every 1-3 years. Bone density scan. This is done to screen for osteoporosis. You may have this done starting at age 68. Mammogram. This may be done every 1-2 years. Talk to your health care provider about how often you should have regular mammograms. Talk with your health care provider about your test results, treatment options, and if necessary, the need for more  tests. Vaccines  Your health care provider may recommend certain vaccines, such as: Influenza vaccine. This is recommended every year. Tetanus, diphtheria, and acellular pertussis (Tdap, Td) vaccine. You may need a Td booster every 10 years. Zoster vaccine. You may need this after age 81. Pneumococcal 13-valent conjugate (PCV13) vaccine. One dose is recommended after age 42. Pneumococcal polysaccharide (PPSV23) vaccine. One dose is recommended after age 34. Talk to your health care provider about which screenings and vaccines you need and how often you need them. This information is not intended to replace advice given to you by your health care provider. Make sure you discuss any questions you have with your health care provider. Document Released: 02/22/2015 Document Revised: 10/16/2015 Document Reviewed: 11/27/2014 Elsevier Interactive Patient Education  2017 [place] Prevention in the Home Falls can cause injuries. They can happen to people of all ages. There are many things you can do to make your home safe and to help prevent falls. What can I do on the outside of my home? Regularly fix the edges of walkways and driveways and fix any cracks. Remove anything that might make you trip as you walk through a door, such as a raised step or threshold. Trim any bushes or trees on the path to your home. Use bright outdoor lighting. Clear any walking paths of anything that might make someone trip, such as rocks or tools. Regularly check to see if handrails are loose or broken. Make sure that both sides of any steps have handrails. Any raised decks and porches should have guardrails on the edges. Have any leaves, snow, or ice cleared regularly. Use sand or salt on walking paths during winter. Clean up any spills in your garage right away. This includes oil or grease spills. What can I do in the bathroom? Use night lights. Install grab bars by the toilet and in the tub and shower.  Do not use towel bars as grab bars. Use non-skid mats or decals in the tub or shower. If you need to sit down in the shower, use a plastic, non-slip stool. Keep the floor dry. Clean up any water that spills on the floor as soon as it happens. Remove soap buildup in the tub or shower regularly. Attach bath mats securely with double-sided non-slip rug tape. Do not have throw rugs and other things on the floor that can make you trip. What can I do in the bedroom? Use night lights. Make sure that you have a light by your bed that is easy to reach. Do not use any sheets or blankets that are too big for your bed. They should not hang down onto the floor. Have a firm chair that has side arms. You can use this for support while you get dressed. Do not have throw rugs and other things on the floor that can make you trip. What can I do in the kitchen? Clean up any spills right away. Avoid walking on wet floors. Keep items that you use a lot in easy-to-reach places. If you need to reach  something above you, use a strong step stool that has a grab bar. Keep electrical cords out of the way. Do not use floor polish or wax that makes floors slippery. If you must use wax, use non-skid floor wax. Do not have throw rugs and other things on the floor that can make you trip. What can I do with my stairs? Do not leave any items on the stairs. Make sure that there are handrails on both sides of the stairs and use them. Fix handrails that are broken or loose. Make sure that handrails are as long as the stairways. Check any carpeting to make sure that it is firmly attached to the stairs. Fix any carpet that is loose or worn. Avoid having throw rugs at the top or bottom of the stairs. If you do have throw rugs, attach them to the floor with carpet tape. Make sure that you have a light switch at the top of the stairs and the bottom of the stairs. If you do not have them, ask someone to add them for you. What else  can I do to help prevent falls? Wear shoes that: Do not have high heels. Have rubber bottoms. Are comfortable and fit you well. Are closed at the toe. Do not wear sandals. If you use a stepladder: Make sure that it is fully opened. Do not climb a closed stepladder. Make sure that both sides of the stepladder are locked into place. Ask someone to hold it for you, if possible. Clearly mark and make sure that you can see: Any grab bars or handrails. First and last steps. Where the edge of each step is. Use tools that help you move around (mobility aids) if they are needed. These include: Canes. Walkers. Scooters. Crutches. Turn on the lights when you go into a dark area. Replace any light bulbs as soon as they burn out. Set up your furniture so you have a clear path. Avoid moving your furniture around. If any of your floors are uneven, fix them. If there are any pets around you, be aware of where they are. Review your medicines with your doctor. Some medicines can make you feel dizzy. This can increase your chance of falling. Ask your doctor what other things that you can do to help prevent falls. This information is not intended to replace advice given to you by your health care provider. Make sure you discuss any questions you have with your health care provider. Document Released: 11/22/2008 Document Revised: 07/04/2015 Document Reviewed: 03/02/2014 Elsevier Interactive Patient Education  2017 [employer].

## 2022-04-08 ENCOUNTER — Other Ambulatory Visit: Payer: Self-pay | Admitting: Family Medicine

## 2022-04-15 ENCOUNTER — Ambulatory Visit (INDEPENDENT_AMBULATORY_CARE_PROVIDER_SITE_OTHER): Payer: Medicare Other | Admitting: Family Medicine

## 2022-04-15 ENCOUNTER — Encounter: Payer: Self-pay | Admitting: Family Medicine

## 2022-04-15 ENCOUNTER — Other Ambulatory Visit: Payer: Self-pay | Admitting: Family Medicine

## 2022-04-15 ENCOUNTER — Ambulatory Visit: Payer: Medicare Other

## 2022-04-15 VITALS — BP 141/88 | HR 97 | Temp 98.6°F | Ht 67.0 in | Wt 157.0 lb

## 2022-04-15 DIAGNOSIS — K5909 Other constipation: Secondary | ICD-10-CM

## 2022-04-15 DIAGNOSIS — M48062 Spinal stenosis, lumbar region with neurogenic claudication: Secondary | ICD-10-CM

## 2022-04-15 DIAGNOSIS — I10 Essential (primary) hypertension: Secondary | ICD-10-CM | POA: Diagnosis not present

## 2022-04-15 DIAGNOSIS — N289 Disorder of kidney and ureter, unspecified: Secondary | ICD-10-CM | POA: Diagnosis not present

## 2022-04-15 DIAGNOSIS — Z23 Encounter for immunization: Secondary | ICD-10-CM

## 2022-04-15 DIAGNOSIS — Z780 Asymptomatic menopausal state: Secondary | ICD-10-CM

## 2022-04-15 MED ORDER — TRAMADOL HCL 50 MG PO TABS: 50.0000 mg | ORAL_TABLET | Freq: Two times a day (BID) | ORAL | 1 refills | Status: DC | PRN

## 2022-04-15 MED ORDER — PANTOPRAZOLE SODIUM 40 MG PO TBEC: 40.0000 mg | DELAYED_RELEASE_TABLET | Freq: Every day | ORAL | 3 refills | Status: DC

## 2022-04-15 NOTE — Progress Notes (Signed)
Subjective: CC: GERD, chronic pain, hypertension PCP: Janora Norlander, DO [id number] Vanessa Pitts is a 77 y.o. female presenting to clinic today for:  1.  Hypertension Patient reports compliance with amlodipine 5 mg daily but actually was not taking her telmisartan 20 mg daily because she did not realize that she was post be taking both.  Her blood pressures unfortunately have continued to remain elevated with systolics typically in the 160s to 170s.  She denies any chest pain, shortness of breath or dizziness.  She did have some left-sided lower extremity swelling but she feels like this was after she may have injured it as there was a bruise associated with that left extremity.  This is also the side that she has chronic issues with that she gets back injections intervally for  2.  GERD Patient reports that the GERD is not well-controlled with Protonix 20 mg daily and she would like to try advancing it.  She gets burning sensation in her stomach still  3.  Chronic constipation Patient has been utilizing an over-the-counter probiotic.  This certainly seems to worsen her constipation.  She has tried products like Linzess in the past which caused her the opposite problem with severe diarrhea.  She feels bloated and is not quite sure what to utilize to regulate her bowels  4.  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  No reports of bony pain.  She does continue to suffer from intermittent left-sided lower extremity pain that is responsive to occasional tramadol when she is in between injections.    ROS: Per HPI  No Known Allergies Past Medical History:  Diagnosis Date   Chronically dry eyes    Hyperlipidemia    Hypertension    Leg pain, left    Osteoporosis    Ovarian cyst     Current Outpatient Medications:    alendronate (FOSAMAX) 70 MG tablet, TAKE 1 TABLET BY MOUTH ONCE A WEEK WITH A FULL GLASS OF WATER ON AN  EMPTY STOMACH, Disp: 12 tablet, Rfl: 0   amLODipine (NORVASC) 5 MG tablet, Take 1 tablet (5 mg total) by mouth daily. For blood pressure, Disp: 90 tablet, Rfl: 3   fluticasone (FLONASE) 50 MCG/ACT nasal spray, Place 1 spray into both nostrils 2 (two) times daily as needed for allergies or rhinitis., Disp: 16 g, Rfl: 6   montelukast (SINGULAIR) 10 MG tablet, Take 1 tablet (10 mg total) by mouth at bedtime. For allergies, Disp: 90 tablet, Rfl: 3   pantoprazole (PROTONIX) 20 MG tablet, Take 1 tablet by mouth once daily, Disp: 90 tablet, Rfl: 0   pravastatin (PRAVACHOL) 20 MG tablet, Take 1 tablet (20 mg total) by mouth daily., Disp: 90 tablet, Rfl: 3   telmisartan (MICARDIS) 20 MG tablet, Take 1 tablet (20 mg total) by mouth daily., Disp: 90 tablet, Rfl: 3   tiZANidine (ZANAFLEX) 4 MG tablet, TAKE 1/2 TO 1 (ONE-HALF TO ONE) TABLET BY MOUTH EVERY 8 HOURS AS NEEDED FOR MUSCLE SPASM, Disp: 30 tablet, Rfl: 12   traMADol (ULTRAM) 50 MG tablet, Take 1 tablet (50 mg total) by mouth every 12 (twelve) hours as needed for moderate pain or severe pain., Disp: 60 tablet, Rfl: 1   triamcinolone cream (KENALOG) 0.1 %, Apply scant amount to finger and rub along ear canal twice daily for 1 week as needed for itching/ dermatitis, Disp: 30 g, Rfl: 0   vitamin B-12 (CYANOCOBALAMIN) 500 MCG tablet, 500 mcg., Disp: , Rfl:  Social History   Socioeconomic History   Marital status: Widowed    Spouse name: Vanessa Pitts   Number of children: 3   Years of education: 13   Highest education level: Some college, no degree  Occupational History   Occupation: retired  Tobacco Use   Smoking status: Never   Smokeless tobacco: Never  Substance and Sexual Activity   Alcohol use: No   Drug use: No   Sexual activity: Yes    Birth control/protection: Post-menopausal  Other Topics Concern   Not on file  Social History Narrative   Husband passed away in 30-Apr-2019.   Pt lives with daughter.1 son and 2 daughters.    Social Determinants  of Health   Financial Resource Strain: Low Risk  (03/31/2022)   Overall Financial Resource Strain (CARDIA)    Difficulty of Paying Living Expenses: Not hard at all  Food Insecurity: No Food Insecurity (03/31/2022)   Hunger Vital Sign    Worried About Running Out of Food in the Last Year: Never true    Ran Out of Food in the Last Year: Never true  Transportation Needs: No Transportation Needs (03/31/2022)   PRAPARE - Hydrologist (Medical): No    Lack of Transportation (Non-Medical): No  Physical Activity: Insufficiently Active (03/31/2022)   Exercise Vital Sign    Days of Exercise per Week: 3 days    Minutes of Exercise per Session: 30 min  Stress: No Stress Concern Present (03/31/2022)   [place]    Feeling of Stress : Not at all  Social Connections: Socially Isolated (03/31/2022)   Social Connection and Isolation Panel [NHANES]    Frequency of Communication with Friends and Family: More than three times a week    Frequency of Social Gatherings with Friends and Family: More than three times a week    Attends Religious Services: Never    Marine scientist or Organizations: No    Attends Archivist Meetings: Never    Marital Status: Widowed  Intimate Partner Violence: Not At Risk (03/31/2022)   Humiliation, Afraid, Rape, and Kick questionnaire    Fear of Current or Ex-Partner: No    Emotionally Abused: No    Physically Abused: No    Sexually Abused: No   Family History  Problem Relation Age of Onset   Heart disease Mother    Diabetes Mother    Hyperlipidemia Mother    Hypertension Mother    Arthritis Mother    Obesity Mother    Arthritis Father     Objective: Office vital signs reviewed. BP (!) 173/89   Pulse 97   Temp 98.6 F (37 C)   Ht '5\' 7"'$  (1.702 m)   Wt 157 lb (71.2 kg)   SpO2 97%   BMI 24.59 kg/m   Physical Examination:  General: Awake, alert, well  nourished, No acute distress HEENT: sclera white, MMM Cardio: regular rate and rhythm, S1S2 heard, no murmurs appreciated Pulm: clear to auscultation bilaterally, no wheezes, rhonchi or rales; normal work of breathing on room air MSK: Ambulating independently.  Gait is minimally antalgic.  Assessment/ Plan: 77 y.o. female   Essential hypertension  Impaired renal function - Plan: Basic Metabolic Panel  Spinal stenosis, lumbar region with neurogenic claudication - Plan: traMADol (ULTRAM) 50 MG tablet, Drug Screen 10 W/Conf, Se  Chronic constipation  Blood pressure is not at goal and I have highly recommended that she resume  use of my Cardis.  Continue amlodipine.  Plan for blood pressure checks daily and she will bring in these readings to me in the next couple of weeks.  If persistently elevated, we will advance either the telmisartan or the amlodipine.  Check renal function. ?  CKD 3a  Tramadol renewed.  UDS and CSA are up-to-date as per office policy.  National narcotic database reviewed and there were no red flags  Will see if we can get her Trulance sample.  It appears however that all of these medications including Linzess or not covered by her insurance.  She might have to focus on something like lactulose as an alternative.  Orders Placed This Encounter  Procedures   Tdap vaccine greater than or equal to 7yo IM   No orders of the defined types were placed in this encounter.    Janora Norlander, DO [place] [phone]

## 2022-04-16 ENCOUNTER — Telehealth: Payer: Self-pay | Admitting: Family Medicine

## 2022-04-16 DIAGNOSIS — M48062 Spinal stenosis, lumbar region with neurogenic claudication: Secondary | ICD-10-CM

## 2022-04-16 LAB — BASIC METABOLIC PANEL
BUN/Creatinine Ratio: 21 (ref 12–28)
BUN: 23 mg/dL (ref 8–27)
CO2: 19 mmol/L — ABNORMAL LOW (ref 20–29)
Calcium: 9.7 mg/dL (ref 8.7–10.3)
Chloride: 102 mmol/L (ref 96–106)
Creatinine, Ser: 1.12 mg/dL — ABNORMAL HIGH (ref 0.57–1.00)
Glucose: 105 mg/dL — ABNORMAL HIGH (ref 70–99)
Potassium: 4.3 mmol/L (ref 3.5–5.2)
Sodium: 141 mmol/L (ref 134–144)
eGFR: 51 mL/min/{1.73_m2} — ABNORMAL LOW (ref >59)

## 2022-04-16 NOTE — Telephone Encounter (Signed)
One is controlled - please advise

## 2022-04-17 ENCOUNTER — Other Ambulatory Visit: Payer: Self-pay | Admitting: Family Medicine

## 2022-04-17 DIAGNOSIS — M48062 Spinal stenosis, lumbar region with neurogenic claudication: Secondary | ICD-10-CM

## 2022-04-17 MED ORDER — PANTOPRAZOLE SODIUM 40 MG PO TBEC: 40.0000 mg | DELAYED_RELEASE_TABLET | Freq: Every day | ORAL | 3 refills | Status: DC

## 2022-04-17 MED ORDER — TRAMADOL HCL 50 MG PO TABS: 50.0000 mg | ORAL_TABLET | Freq: Two times a day (BID) | ORAL | 1 refills | Status: AC | PRN

## 2022-04-17 NOTE — Telephone Encounter (Signed)
Pt aware.

## 2022-04-17 NOTE — Telephone Encounter (Signed)
Attempted to contact - nvm  When patient calls back let her know medication sent

## 2022-04-17 NOTE — Telephone Encounter (Signed)
done

## 2022-04-23 IMAGING — XA Imaging study
2 series · 2 of 2 positions shown · non-contrast
Comparison: none

CLINICAL DATA: Lumbosacral spondylosis without myelopathy. Good
response to multiple prior caudal epidural injections including 90%
relief following the injection in [DATE]. Left leg pain has
recurred.

[Series 1: ortho adipose · 1 of 1 slices shown (1 of 2)]
[im 1/1]
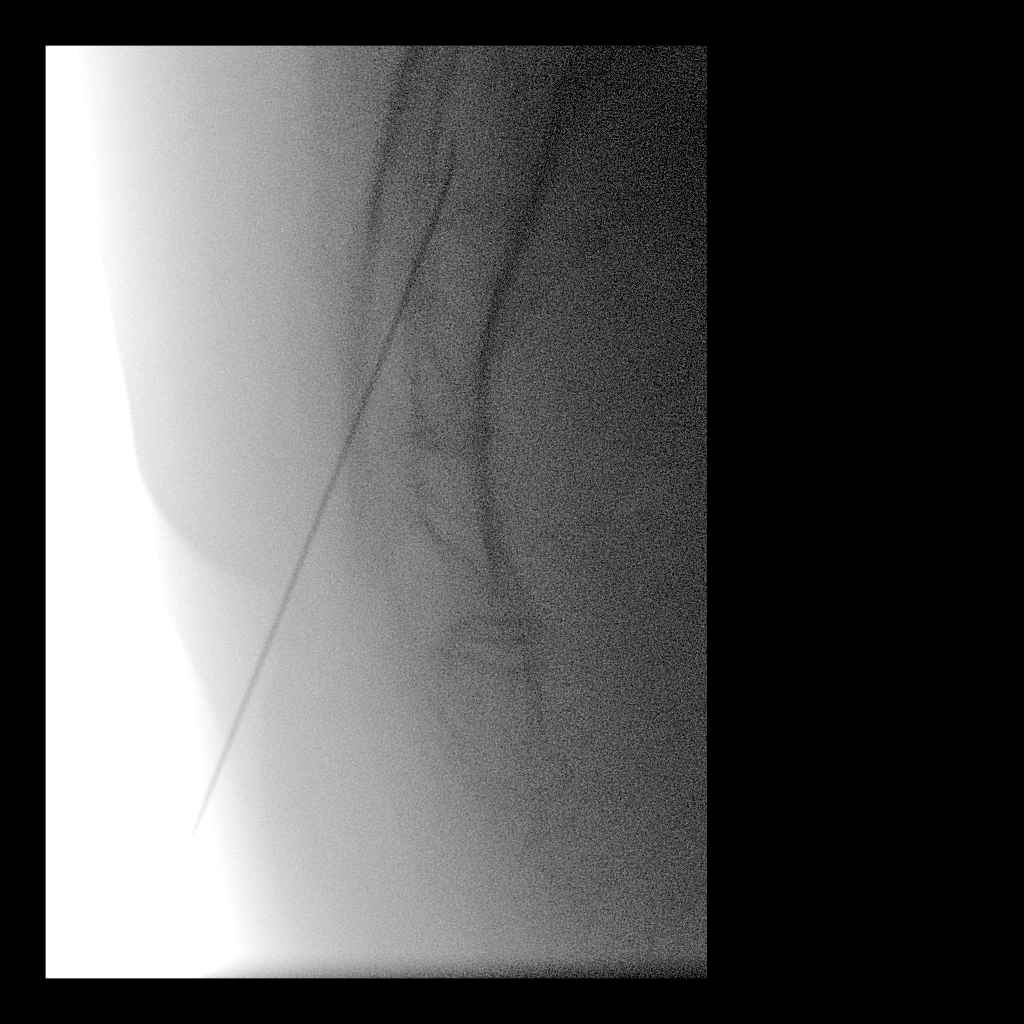

[Series 3: ortho adipose · 1 of 1 slices shown (2 of 2)]
[im 1/1]
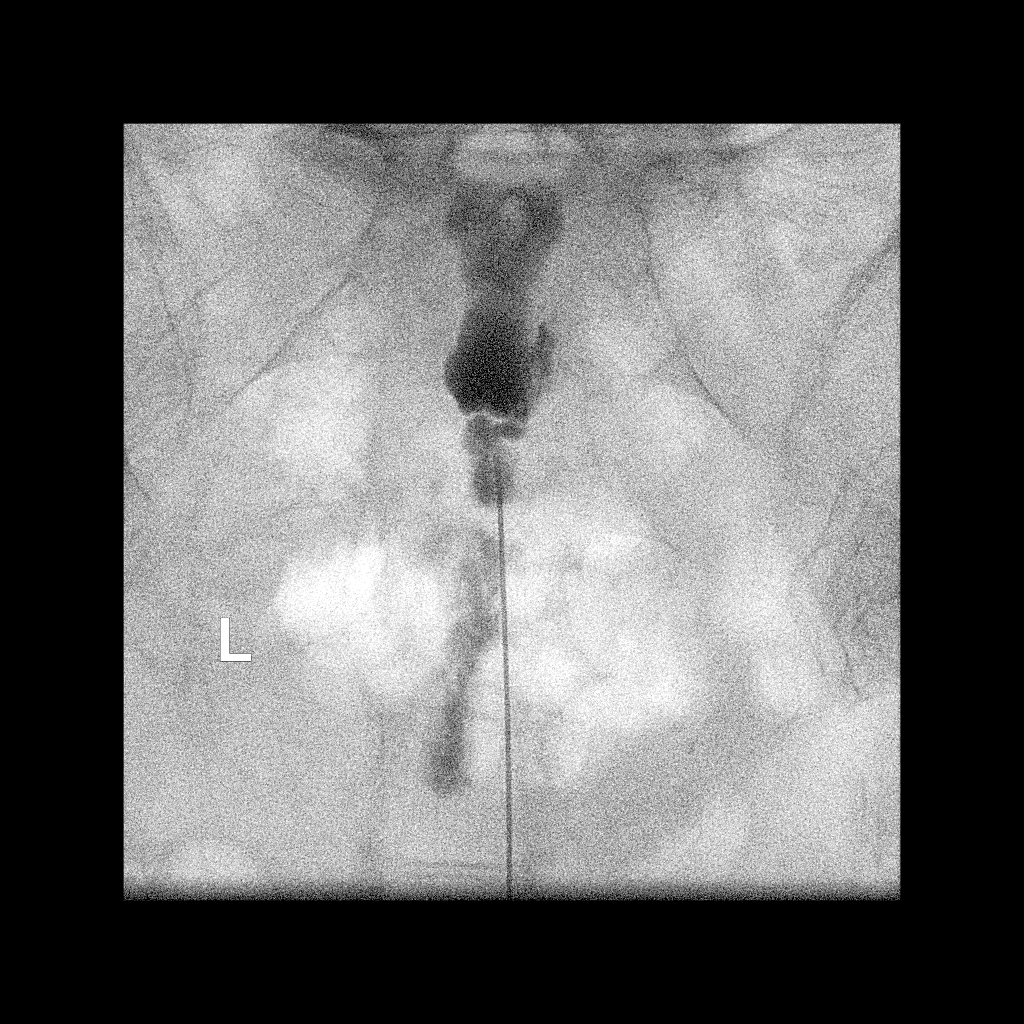

[2 of 2 positions shown; findings below may reference images not displayed]

FLUOROSCOPY TIME:  Fluoroscopy Time: 15 seconds

Radiation Exposure Index: 13.46 microGray*m^2

EXAM:
CAUDAL EPIDURAL INJECTION

Utilizing a caudal approach, the skin overlying the sacral hiatus
was cleansed and anesthetized. A 3.5 inch 22 gauge epidural needle
was advanced into the sacral epidural space. Injection of Isovue-M
200 shows a good epidural pattern with spread up to L5-S1. No
vascular opacification is seen.

120 mg of Depo-Medrol mixed with 3 ml of normal saline and 3 ml of
1% Lidocaine were instilled. The procedure was well-tolerated, and
the patient was discharged thirty minutes following the injection in
good condition.
IMPRESSION: Technically successful caudal epidural injection.

## 2022-05-30 ENCOUNTER — Other Ambulatory Visit: Payer: Self-pay | Admitting: Family Medicine

## 2022-06-22 ENCOUNTER — Telehealth: Payer: Self-pay | Admitting: Family Medicine

## 2022-06-22 MED ORDER — ALENDRONATE SODIUM 70 MG PO TABS: ORAL_TABLET | ORAL | 0 refills | Status: DC

## 2022-06-22 NOTE — Telephone Encounter (Signed)
NA/NVM setup, refill sent to pharmacy

## 2022-06-22 NOTE — Telephone Encounter (Signed)
  Prescription Request  06/22/2022  Is this a "Controlled Substance" medicine? no  Have you seen your PCP in the last 2 weeks? no  If YES, route message to pool  -  If NO, patient needs to be scheduled for appointment.  What is the name of the medication or equipment? Alendronate 70 mg  Have you contacted your pharmacy to request a refill? yes   Which pharmacy would you like this sent to? [facility] in [place]   Patient notified that their request is being sent to the clinical staff for review and that they should receive a response within 2 business days.

## 2022-07-31 ENCOUNTER — Other Ambulatory Visit: Payer: Self-pay

## 2022-07-31 DIAGNOSIS — Z1211 Encounter for screening for malignant neoplasm of colon: Secondary | ICD-10-CM

## 2022-08-24 ENCOUNTER — Encounter: Payer: Self-pay | Admitting: Family Medicine

## 2022-08-24 ENCOUNTER — Ambulatory Visit (INDEPENDENT_AMBULATORY_CARE_PROVIDER_SITE_OTHER): Payer: Medicare Other | Admitting: Family Medicine

## 2022-08-24 VITALS — BP 143/85 | HR 96 | Temp 98.3°F | Ht 67.0 in | Wt 153.0 lb

## 2022-08-24 DIAGNOSIS — I10 Essential (primary) hypertension: Secondary | ICD-10-CM | POA: Diagnosis not present

## 2022-08-24 DIAGNOSIS — M48062 Spinal stenosis, lumbar region with neurogenic claudication: Secondary | ICD-10-CM | POA: Diagnosis not present

## 2022-08-24 DIAGNOSIS — K5909 Other constipation: Secondary | ICD-10-CM

## 2022-08-24 DIAGNOSIS — N1831 Chronic kidney disease, stage 3a: Secondary | ICD-10-CM

## 2022-08-24 DIAGNOSIS — H60543 Acute eczematoid otitis externa, bilateral: Secondary | ICD-10-CM

## 2022-08-24 MED ORDER — TRIAMCINOLONE ACETONIDE 0.1 % EX CREA: TOPICAL_CREAM | CUTANEOUS | 0 refills | Status: AC

## 2022-08-24 MED ORDER — TRULANCE 3 MG PO TABS: ORAL_TABLET | ORAL | Status: DC

## 2022-08-24 NOTE — Progress Notes (Signed)
Subjective: CC: Chronic follow-up PCP: [name], DO ACZ:YSAY Vanessa Pitts is a 77 y.o. female presenting to clinic today for:  1.  Impaired renal function associated with hypertension Patient is compliant with Norvasc, Micardis.  She does not report any chest pain, shortness of breath, edema, change in urine output.  She drinks water and soda daily.  Sometimes she feels like she gets a little chill but is not sure if this is due to air conditioning as it does resolve with the blanket.  No NSAID use but does use Excedrin Migraine occasionally  2.  Spondylosis of the lumbar spine Patient continues to see Dr. Salem Senate for spinal epidural.  Her last was about 7 months ago.  She feels like she is coming up due for another 1 and asked for a order to be sent to his office.  Has plenty of tramadol left over does not need a refill of this at this time  Does experience constipation and this was not relieved by Linzess.  She forgot to get the Trulance prior to discharge last time   ROS: Per HPI  No Known Allergies Past Medical History:  Diagnosis Date   Chronically dry eyes    Hyperlipidemia    Hypertension    Leg pain, left    Osteoporosis    Ovarian cyst     Current Outpatient Medications:    Plecanatide (TRULANCE) 3 MG TABS, Take 1 tablet daily for constipation, Disp: , Rfl:    alendronate (FOSAMAX) 70 MG tablet, TAKE 1 TABLET BY MOUTH ONCE A WEEK WITH A FULL GLASS OF WATER ON AN EMPTY STOMACH, Disp: 12 tablet, Rfl: 0   amLODipine (NORVASC) 5 MG tablet, Take 1 tablet (5 mg total) by mouth daily. For blood pressure, Disp: 90 tablet, Rfl: 3   fluticasone (FLONASE) 50 MCG/ACT nasal spray, Place 1 spray into both nostrils 2 (two) times daily as needed for allergies or rhinitis., Disp: 16 g, Rfl: 6   montelukast (SINGULAIR) 10 MG tablet, Take 1 tablet (10 mg total) by mouth at bedtime. For allergies, Disp: 90 tablet, Rfl: 3   pantoprazole (PROTONIX) 40 MG tablet, Take 1 tablet  (40 mg total) by mouth daily., Disp: 90 tablet, Rfl: 3   pravastatin (PRAVACHOL) 20 MG tablet, Take 1 tablet (20 mg total) by mouth daily., Disp: 90 tablet, Rfl: 3   telmisartan (MICARDIS) 20 MG tablet, Take 1 tablet (20 mg total) by mouth daily., Disp: 90 tablet, Rfl: 3   tiZANidine (ZANAFLEX) 4 MG tablet, TAKE 1/2 TO 1 (ONE-HALF TO ONE) TABLET BY MOUTH EVERY 8 HOURS AS NEEDED FOR MUSCLE SPASM, Disp: 30 tablet, Rfl: 12   traMADol (ULTRAM) 50 MG tablet, Take 1 tablet (50 mg total) by mouth every 12 (twelve) hours as needed for moderate pain or severe pain., Disp: 60 tablet, Rfl: 1   triamcinolone cream (KENALOG) 0.1 %, Apply scant amount to finger and rub along ear canal twice daily for 1 week as needed for itching/ dermatitis, Disp: 30 g, Rfl: 0   vitamin B-12 (CYANOCOBALAMIN) 500 MCG tablet, 500 mcg., Disp: , Rfl:  Social History   Socioeconomic History   Marital status: Widowed    Spouse name: Shirlee Limerick   Number of children: 3   Years of education: 13   Highest education level: Some college, no degree  Occupational History   Occupation: retired  Tobacco Use   Smoking status: Never   Smokeless tobacco: Never  Substance and Sexual Activity   Alcohol  use: No   Drug use: No   Sexual activity: Yes    Birth control/protection: Post-menopausal  Other Topics Concern   Not on file  Social History Narrative   Husband passed away in 09/08/19.   Pt lives with daughter.1 son and 2 daughters.    Social Determinants of Health   Financial Resource Strain: Low Risk  (03/31/2022)   Overall Financial Resource Strain (CARDIA)    Difficulty of Paying Living Expenses: Not hard at all  Food Insecurity: No Food Insecurity (03/31/2022)   Hunger Vital Sign    Worried About Running Out of Food in the Last Year: Never true    Ran Out of Food in the Last Year: Never true  Transportation Needs: No Transportation Needs (03/31/2022)   PRAPARE - Administrator, Civil Service (Medical): No    Lack of  Transportation (Non-Medical): No  Physical Activity: Insufficiently Active (03/31/2022)   Exercise Vital Sign    Days of Exercise per Week: 3 days    Minutes of Exercise per Session: 30 min  Stress: No Stress Concern Present (03/31/2022)   [employer] of Occupational Health - Occupational Stress Questionnaire    Feeling of Stress : Not at all  Social Connections: Socially Isolated (03/31/2022)   Social Connection and Isolation Panel [NHANES]    Frequency of Communication with Friends and Family: More than three times a week    Frequency of Social Gatherings with Friends and Family: More than three times a week    Attends Religious Services: Never    Database administrator or Organizations: No    Attends Banker Meetings: Never    Marital Status: Widowed  Intimate Partner Violence: Not At Risk (03/31/2022)   Humiliation, Afraid, Rape, and Kick questionnaire    Fear of Current or Ex-Partner: No    Emotionally Abused: No    Physically Abused: No    Sexually Abused: No   Family History  Problem Relation Age of Onset   Heart disease Mother    Diabetes Mother    Hyperlipidemia Mother    Hypertension Mother    Arthritis Mother    Obesity Mother    Arthritis Father     Objective: Office vital signs reviewed. BP (!) 143/85   Pulse 96   Temp 98.3 F (36.8 C)   Ht 5\' 7"  (1.702 m)   Wt 153 lb (69.4 kg)   SpO2 100%   BMI 23.96 kg/m   Physical Examination:  General: Awake, alert, well nourished, No acute distress HEENT: sclera white, MMM Cardio: regular rate and rhythm, S1S2 heard, no murmurs appreciated Pulm: clear to auscultation bilaterally, no wheezes, rhonchi or rales; normal work of breathing on room air GI: soft, non-tender, non-distended, bowel sounds present x4, no hepatomegaly, no splenomegaly, no masses MSK: Ambulating independently  Assessment/ Plan: 77 y.o. female   Essential hypertension - Plan: Renal Function Panel  Stage 3a chronic kidney  disease (HCC) - Plan: Renal Function Panel, CBC, VITAMIN D 25 Hydroxy (Vit-D Deficiency, Fractures), CANCELED: Microalbumin / creatinine urine ratio  Spinal stenosis, lumbar region with neurogenic claudication - Plan: Drug Screen 10 W/Conf, Se, CANCELED: ToxASSURE Select 13 (MW), Urine  Chronic constipation - Plan: Plecanatide (TRULANCE) 3 MG TABS  Dermatitis of both ear canals - Plan: triamcinolone cream (KENALOG) 0.1 %  Blood pressure is at goal for age upon recheck.  Reemphasized need for blood pressure control given what appears to be CKD 3A.  Ongoing to recheck her  renal function, CBC, vitamin D.  She cannot void so urine microalbumin plan for next visit.  We discussed potential start of Farxiga versus Jardiance for renal disease.  She is open to this but would like to trial cutting back on salt intake and sodas for the next couple of months and then revisiting labs again  Could not void so UDS canceled and blood drug screen collected.  She has tramadol on hand and updated CSC.  Faxed Rx for caudal epidural sent to Dr. Tobi Bastos office  Trulance samples provided for constipation.  Not sure if insurance will cover so may need to consider something like Amitiza as her symptoms were refractory to Linzess  Kenalog prescribed for as needed use.  Use sparingly for dermatitis of bilateral ear canals  Orders Placed This Encounter  Procedures   Renal Function Panel   CBC   VITAMIN D 25 Hydroxy (Vit-D Deficiency, Fractures)   Drug Screen 10 W/Conf, Se   Meds ordered this encounter  Medications   Plecanatide (TRULANCE) 3 MG TABS    Sig: Take 1 tablet daily for constipation   triamcinolone cream (KENALOG) 0.1 %    Sig: Apply scant amount to finger and rub along ear canal twice daily for 1 week as needed for itching/ dermatitis    Dispense:  30 g    Refill:  0     Excell Neyland Hulen Skains, DO [facility] [phone]

## 2022-08-24 NOTE — Patient Instructions (Signed)
Chronic Kidney Disease, Adult Chronic kidney disease is when lasting damage happens to the kidneys slowly over a long time. The kidneys help to: Make pee (urine). Make hormones. Keep the right amount of fluids and chemicals in the body. Most often, this disease does not go away. You must take steps to help keep the kidney damage from getting worse. If steps are not taken, the kidneys might stop working forever. What are the causes? Diabetes. High blood pressure. Diseases that affect the heart and blood vessels. Other kidney diseases. Diseases of the body's disease-fighting system. A problem with the flow of pee. Infections of the organs that make pee, store it, and take it out of the body. Swelling or irritation of your blood vessels. What increases the risk? Getting older. Having someone in your family who has kidney disease or kidney failure. Having a disease caused by genes. Taking medicines often that harm the kidneys. Being near or having contact with harmful substances. Being very overweight. Using tobacco now or in the past. What are the signs or symptoms? Feeling very tired. Having a swollen face, legs, ankles, or feet. Feeling like you may vomit or vomiting. Not feeling hungry. Being confused or not able to focus. Twitches and cramps in the leg muscles or other muscles. Dry, itchy skin. A taste of metal in your mouth. Making less pee, or making more pee. Shortness of breath. Trouble sleeping. You may also become anemic or get weak bones. Anemic means there is not enough red blood cells or hemoglobin in your blood. You may get symptoms slowly. You may not notice them until the kidney damage gets very bad. How is this treated? Often, there is no cure for this disease. Treatment can help with symptoms and help keep the disease from getting worse. You may need to: Avoid alcohol. Avoid foods that are high in salt, potassium, phosphorous, and protein. Take medicines for  symptoms and to help control other conditions. Have dialysis. This treatment gets harmful waste out of your body. Treat other problems that cause your kidney disease or make it worse. Follow these instructions at home: Medicines Take over-the-counter and prescription medicines only as told by your doctor. Do not take any new medicines, vitamins, or supplements unless your doctor says it is okay. Lifestyle  Do not smoke or use any products that contain nicotine or tobacco. If you need help quitting, ask your doctor. If you drink alcohol: Limit how much you use to: 0-1 drink a day for women who are not pregnant. 0-2 drinks a day for men. Know how much alcohol is in your drink. In the [place] one drink equals one 12 oz bottle of beer (355 mL), one 5 oz glass of wine (148 mL), or one 1 oz glass of hard liquor (44 mL). Stay at a healthy weight. If you need help losing weight, ask your doctor. General instructions  Follow instructions from your doctor about what you cannot eat or drink. Track your blood pressure at home. Tell your doctor about any changes. If you have diabetes, track your blood sugar. Exercise at least 30 minutes a day, 5 days a week. Keep your shots (vaccinations) up to date. Keep all follow-up visits. Where to find more information American Association of Kidney Patients: www.aakp.org National Kidney Foundation: www.kidney.org American Kidney Fund: www.akfinc.org Life Options: www.lifeoptions.org Kidney School: www.kidneyschool.org Contact a doctor if: Your symptoms get worse. You get new symptoms. Get help right away if: You get symptoms of end-stage kidney disease. These   include: Headaches. Losing feeling in your hands or feet. Easy bruising. Having hiccups often. Chest pain. Shortness of breath. Lack of menstrual periods, in women. You have a fever. You make less pee than normal. You have pain or you bleed when you pee or poop. These symptoms may be an  emergency. Get help right away. Call your local emergency services (911 in the [place] Do not wait to see if the symptoms will go away. Do not drive yourself to the hospital. Summary Chronic kidney disease is when lasting damage happens to the kidneys slowly over a long time. Causes of this disease include diabetes and high blood pressure. Often, there is no cure for this disease. Treatment can help symptoms and help keep the disease from getting worse. Treatment may involve lifestyle changes, medicines, and dialysis. This information is not intended to replace advice given to you by your health care provider. Make sure you discuss any questions you have with your health care provider. Document Revised: 05/03/2019 Document Reviewed: 05/03/2019 Elsevier Patient Education  2024 Elsevier Inc.  

## 2022-08-25 LAB — VITAMIN D 25 HYDROXY (VIT D DEFICIENCY, FRACTURES): Vit D, 25-Hydroxy: 40 ng/mL (ref 30.0–100.0)

## 2022-08-25 LAB — RENAL FUNCTION PANEL
Albumin: 4.5 g/dL (ref 3.8–4.8)
BUN/Creatinine Ratio: 17 (ref 12–28)
BUN: 18 mg/dL (ref 8–27)
CO2: 22 mmol/L (ref 20–29)
Calcium: 9.5 mg/dL (ref 8.7–10.3)
Chloride: 98 mmol/L (ref 96–106)
Creatinine, Ser: 1.04 mg/dL — ABNORMAL HIGH (ref 0.57–1.00)
Glucose: 110 mg/dL — ABNORMAL HIGH (ref 70–99)
Phosphorus: 2.8 mg/dL — ABNORMAL LOW (ref 3.0–4.3)
Potassium: 4.1 mmol/L (ref 3.5–5.2)
Sodium: 138 mmol/L (ref 134–144)
eGFR: 56 mL/min/{1.73_m2} — ABNORMAL LOW (ref >59)

## 2022-08-25 LAB — CBC
Hematocrit: 42.3 % (ref 34.0–46.6)
Hemoglobin: 14.4 g/dL (ref 11.1–15.9)
MCH: 29.1 pg (ref 26.6–33.0)
MCHC: 34 g/dL (ref 31.5–35.7)
MCV: 86 fL (ref 79–97)
Platelets: 291 10*3/uL (ref 150–450)
RBC: 4.94 x10E6/uL (ref 3.77–5.28)
RDW: 13.1 % (ref 11.7–15.4)
WBC: 6.2 10*3/uL (ref 3.4–10.8)

## 2022-08-26 LAB — DRUG SCREEN 10 W/CONF, SERUM
Amphetamines, IA: NEGATIVE ng/mL
Barbiturates, IA: NEGATIVE ug/mL
Benzodiazepines, IA: NEGATIVE ng/mL
Cocaine & Metabolite, IA: NEGATIVE ng/mL
Methadone, IA: NEGATIVE ng/mL
Opiates, IA: NEGATIVE ng/mL
Oxycodones, IA: NEGATIVE ng/mL
Phencyclidine, IA: NEGATIVE ng/mL
Propoxyphene, IA: NEGATIVE ng/mL
THC(Marijuana) Metabolite, IA: NEGATIVE ng/mL

## 2022-08-31 ENCOUNTER — Telehealth: Payer: Self-pay | Admitting: Family Medicine

## 2022-08-31 NOTE — Telephone Encounter (Signed)
[facility] was given this order to fax last week.  Please refax if they have not received.

## 2022-09-03 ENCOUNTER — Other Ambulatory Visit: Payer: Self-pay | Admitting: Family Medicine

## 2022-09-03 DIAGNOSIS — M47897 Other spondylosis, lumbosacral region: Secondary | ICD-10-CM

## 2022-09-15 NOTE — Discharge Instructions (Signed)

## 2022-09-16 ENCOUNTER — Ambulatory Visit
Admission: RE | Admit: 2022-09-16 | Discharge: 2022-09-16 | Disposition: A | Discharge status: Home / Self Care | Payer: Medicare Other | Source: Ambulatory Visit | Attending: Family Medicine | Admitting: Family Medicine

## 2022-09-16 DIAGNOSIS — M47897 Other spondylosis, lumbosacral region: Secondary | ICD-10-CM

## 2022-09-16 MED ORDER — METHYLPREDNISOLONE ACETATE 40 MG/ML INJ SUSP (RADIOLOG
80.0000 mg | Freq: Once | INTRAMUSCULAR | Status: AC
  Administered 2022-09-16: 80 mg via EPIDURAL

## 2022-09-16 MED ORDER — IOPAMIDOL (ISOVUE-M 300) INJECTION 61%
1.0000 mL | Freq: Once | INTRAMUSCULAR | Status: AC | PRN
  Administered 2022-09-16: 1 mL via EPIDURAL

## 2022-10-22 ENCOUNTER — Other Ambulatory Visit: Payer: Self-pay | Admitting: Family Medicine

## 2022-11-04 ENCOUNTER — Encounter: Payer: Self-pay | Admitting: Family Medicine

## 2022-11-04 ENCOUNTER — Ambulatory Visit (INDEPENDENT_AMBULATORY_CARE_PROVIDER_SITE_OTHER): Payer: Medicare Other | Admitting: Family Medicine

## 2022-11-04 ENCOUNTER — Other Ambulatory Visit: Payer: Medicare Other

## 2022-11-04 VITALS — BP 147/86 | HR 94 | Temp 98.6°F | Ht 67.0 in | Wt 155.4 lb

## 2022-11-04 DIAGNOSIS — N1831 Chronic kidney disease, stage 3a: Secondary | ICD-10-CM

## 2022-11-04 DIAGNOSIS — M62838 Other muscle spasm: Secondary | ICD-10-CM

## 2022-11-04 DIAGNOSIS — Z23 Encounter for immunization: Secondary | ICD-10-CM

## 2022-11-04 MED ORDER — TIZANIDINE HCL 4 MG PO TABS: 4.0000 mg | ORAL_TABLET | Freq: Three times a day (TID) | ORAL | 12 refills | Status: AC | PRN

## 2022-11-04 NOTE — Patient Instructions (Signed)
Chronic Kidney Disease, Adult Chronic kidney disease is when lasting damage happens to the kidneys slowly over a long time. The kidneys help to: Make pee (urine). Make hormones. Keep the right amount of fluids and chemicals in the body. Most often, this disease does not go away. You must take steps to help keep the kidney damage from getting worse. If steps are not taken, the kidneys might stop working forever. What are the causes? Diabetes. High blood pressure. Diseases that affect the heart and blood vessels. Other kidney diseases. Diseases of the body's disease-fighting system. A problem with the flow of pee. Infections of the organs that make pee, store it, and take it out of the body. Swelling or irritation of your blood vessels. What increases the risk? Getting older. Having someone in your family who has kidney disease or kidney failure. Having a disease caused by genes. Taking medicines often that harm the kidneys. Being near or having contact with harmful substances. Being very overweight. Using tobacco now or in the past. What are the signs or symptoms? Feeling very tired. Having a swollen face, legs, ankles, or feet. Feeling like you may vomit or vomiting. Not feeling hungry. Being confused or not able to focus. Twitches and cramps in the leg muscles or other muscles. Dry, itchy skin. A taste of metal in your mouth. Making less pee, or making more pee. Shortness of breath. Trouble sleeping. You may also become anemic or get weak bones. Anemic means there is not enough red blood cells or hemoglobin in your blood. You may get symptoms slowly. You may not notice them until the kidney damage gets very bad. How is this treated? Often, there is no cure for this disease. Treatment can help with symptoms and help keep the disease from getting worse. You may need to: Avoid alcohol. Avoid foods that are high in salt, potassium, phosphorous, and protein. Take medicines for  symptoms and to help control other conditions. Have dialysis. This treatment gets harmful waste out of your body. Treat other problems that cause your kidney disease or make it worse. Follow these instructions at home: Medicines Take over-the-counter and prescription medicines only as told by your doctor. Do not take any new medicines, vitamins, or supplements unless your doctor says it is okay. Lifestyle  Do not smoke or use any products that contain nicotine or tobacco. If you need help quitting, ask your doctor. If you drink alcohol: Limit how much you use to: 0-1 drink a day for women who are not pregnant. 0-2 drinks a day for men. Know how much alcohol is in your drink. In the [place] one drink equals one 12 oz bottle of beer (355 mL), one 5 oz glass of wine (148 mL), or one 1 oz glass of hard liquor (44 mL). Stay at a healthy weight. If you need help losing weight, ask your doctor. General instructions  Follow instructions from your doctor about what you cannot eat or drink. Track your blood pressure at home. Tell your doctor about any changes. If you have diabetes, track your blood sugar. Exercise at least 30 minutes a day, 5 days a week. Keep your shots (vaccinations) up to date. Keep all follow-up visits. Where to find more information American Association of Kidney Patients: ResidentialShow.is [employer]: www.kidney.org American Kidney Fund: FightingMatch.com.ee Life Options: www.lifeoptions.org Kidney School: www.kidneyschool.org Contact a doctor if: Your symptoms get worse. You get new symptoms. Get help right away if: You get symptoms of end-stage kidney disease. These  include: Headaches. Losing feeling in your hands or feet. Easy bruising. Having hiccups often. Chest pain. Shortness of breath. Lack of menstrual periods, in women. You have a fever. You make less pee than normal. You have pain or you bleed when you pee or poop. These symptoms may be an  emergency. Get help right away. Call your local emergency services (911 in the [place] Do not wait to see if the symptoms will go away. Do not drive yourself to the hospital. Summary Chronic kidney disease is when lasting damage happens to the kidneys slowly over a long time. Causes of this disease include diabetes and high blood pressure. Often, there is no cure for this disease. Treatment can help symptoms and help keep the disease from getting worse. Treatment may involve lifestyle changes, medicines, and dialysis. This information is not intended to replace advice given to you by your health care provider. Make sure you discuss any questions you have with your health care provider. Document Revised: 05/03/2019 Document Reviewed: 05/03/2019 Elsevier Patient Education  2024 [employer].

## 2022-11-04 NOTE — Progress Notes (Signed)
Subjective: CC: Impaired renal function PCP: [name], DO OVF:IEPP Vanessa Pitts is a 77 y.o. female presenting to clinic today for:  1.  Impaired renal function Patient with what seems to be CKD 3A.  She has been hydrating really well.  She is compliant with her Micardis and amlodipine.  She reports no chest pain, shortness of breath or edema.   ROS: Per HPI  No Known Allergies Past Medical History:  Diagnosis Date   Chronically dry eyes    Hyperlipidemia    Hypertension    Leg pain, left    Osteoporosis    Ovarian cyst     Current Outpatient Medications:    alendronate (FOSAMAX) 70 MG tablet, TAKE 1 TABLET BY MOUTH ONCE A WEEK WITH  A  FULL  GLASS  OF  WATER  ON  AN  EMPTY  STOMACH, Disp: 12 tablet, Rfl: 0   amLODipine (NORVASC) 5 MG tablet, Take 1 tablet (5 mg total) by mouth daily. For blood pressure, Disp: 90 tablet, Rfl: 3   fluticasone (FLONASE) 50 MCG/ACT nasal spray, Place 1 spray into both nostrils 2 (two) times daily as needed for allergies or rhinitis., Disp: 16 g, Rfl: 6   montelukast (SINGULAIR) 10 MG tablet, Take 1 tablet (10 mg total) by mouth at bedtime. For allergies, Disp: 90 tablet, Rfl: 3   pantoprazole (PROTONIX) 40 MG tablet, Take 1 tablet (40 mg total) by mouth daily., Disp: 90 tablet, Rfl: 3   Plecanatide (TRULANCE) 3 MG TABS, Take 1 tablet daily for constipation, Disp: , Rfl:    pravastatin (PRAVACHOL) 20 MG tablet, Take 1 tablet (20 mg total) by mouth daily., Disp: 90 tablet, Rfl: 3   telmisartan (MICARDIS) 20 MG tablet, Take 1 tablet (20 mg total) by mouth daily., Disp: 90 tablet, Rfl: 3   tiZANidine (ZANAFLEX) 4 MG tablet, TAKE 1/2 TO 1 (ONE-HALF TO ONE) TABLET BY MOUTH EVERY 8 HOURS AS NEEDED FOR MUSCLE SPASM, Disp: 30 tablet, Rfl: 12   traMADol (ULTRAM) 50 MG tablet, Take 1 tablet (50 mg total) by mouth every 12 (twelve) hours as needed for moderate pain or severe pain., Disp: 60 tablet, Rfl: 1   triamcinolone cream (KENALOG) 0.1 %, Apply scant  amount to finger and rub along ear canal twice daily for 1 week as needed for itching/ dermatitis, Disp: 30 g, Rfl: 0   vitamin B-12 (CYANOCOBALAMIN) 500 MCG tablet, 500 mcg., Disp: , Rfl:  Social History   Socioeconomic History   Marital status: Widowed    Spouse name: Shirlee Limerick   Number of children: 3   Years of education: 13   Highest education level: Some college, no degree  Occupational History   Occupation: retired  Tobacco Use   Smoking status: Never   Smokeless tobacco: Never  Substance and Sexual Activity   Alcohol use: No   Drug use: No   Sexual activity: Yes    Birth control/protection: Post-menopausal  Other Topics Concern   Not on file  Social History Narrative   Husband passed away in 11/14/2019.   Pt lives with daughter.1 son and 2 daughters.    Social Determinants of Health   Financial Resource Strain: Low Risk  (03/31/2022)   Overall Financial Resource Strain (CARDIA)    Difficulty of Paying Living Expenses: Not hard at all  Food Insecurity: No Food Insecurity (03/31/2022)   Hunger Vital Sign    Worried About Running Out of Food in the Last Year: Never true    Ran  Out of Food in the Last Year: Never true  Transportation Needs: No Transportation Needs (03/31/2022)   PRAPARE - Administrator, Civil Service (Medical): No    Lack of Transportation (Non-Medical): No  Physical Activity: Insufficiently Active (03/31/2022)   Exercise Vital Sign    Days of Exercise per Week: 3 days    Minutes of Exercise per Session: 30 min  Stress: No Stress Concern Present (03/31/2022)   [employer] of Occupational Health - Occupational Stress Questionnaire    Feeling of Stress : Not at all  Social Connections: Socially Isolated (03/31/2022)   Social Connection and Isolation Panel [NHANES]    Frequency of Communication with Friends and Family: More than three times a week    Frequency of Social Gatherings with Friends and Family: More than three times a week    Attends  Religious Services: Never    Database administrator or Organizations: No    Attends Banker Meetings: Never    Marital Status: Widowed  Intimate Partner Violence: Not At Risk (03/31/2022)   Humiliation, Afraid, Rape, and Kick questionnaire    Fear of Current or Ex-Partner: No    Emotionally Abused: No    Physically Abused: No    Sexually Abused: No   Family History  Problem Relation Age of Onset   Heart disease Mother    Diabetes Mother    Hyperlipidemia Mother    Hypertension Mother    Arthritis Mother    Obesity Mother    Arthritis Father     Objective: Office vital signs reviewed. BP (!) 147/86   Pulse 94   Temp 98.6 F (37 C)   Ht 5\' 7"  (1.702 m)   Wt 155 lb 6.4 oz (70.5 kg)   SpO2 99%   BMI 24.34 kg/m   Physical Examination:  General: Awake, alert, well nourished, No acute distress HEENT: Sclera white.  Moist mucous membranes Cardio: regular rate and rhythm, S1S2 heard, no murmurs appreciated Pulm: clear to auscultation bilaterally, no wheezes, rhonchi or rales; normal work of breathing on room air Extremities: warm, well perfused, No edema, cyanosis or clubbing; +2 pulses bilaterally    Assessment/ Plan: 77 y.o. female   Stage 3a chronic kidney disease (HCC) - Plan: Basic Metabolic Panel  Neck muscle spasm - Plan: tiZANidine (ZANAFLEX) 4 MG tablet  Check renal function.  Anticipate need for Farxiga pending labs.  She is amenable to medication.  Will plan to send Rx to pharmacy so that she can see how much it cost and if too costly we will get her set up with Raynelle Fanning for patient assistance program.  Influenza vaccination administered  Did not discuss muscle spasms but needed refills on COVID-19 which she takes sparingly   [name], DO [facility] [phone]

## 2022-11-05 LAB — BASIC METABOLIC PANEL
BUN/Creatinine Ratio: 13 (ref 12–28)
BUN: 13 mg/dL (ref 8–27)
CO2: 24 mmol/L (ref 20–29)
Calcium: 9.6 mg/dL (ref 8.7–10.3)
Chloride: 100 mmol/L (ref 96–106)
Creatinine, Ser: 1.03 mg/dL — ABNORMAL HIGH (ref 0.57–1.00)
Glucose: 101 mg/dL — ABNORMAL HIGH (ref 70–99)
Potassium: 4.2 mmol/L (ref 3.5–5.2)
Sodium: 139 mmol/L (ref 134–144)
eGFR: 56 mL/min/{1.73_m2} — ABNORMAL LOW (ref >59)

## 2022-11-06 ENCOUNTER — Other Ambulatory Visit: Payer: Self-pay | Admitting: Family Medicine

## 2022-11-06 DIAGNOSIS — N1831 Chronic kidney disease, stage 3a: Secondary | ICD-10-CM

## 2022-11-06 MED ORDER — FARXIGA 5 MG PO TABS: 5.0000 mg | ORAL_TABLET | Freq: Every day | ORAL | 2 refills | Status: DC

## 2022-11-09 DIAGNOSIS — Z23 Encounter for immunization: Secondary | ICD-10-CM | POA: Diagnosis not present

## 2022-11-20 ENCOUNTER — Telehealth: Payer: Self-pay | Admitting: Family Medicine

## 2022-11-20 ENCOUNTER — Encounter: Payer: Self-pay | Admitting: Pharmacist

## 2022-11-20 DIAGNOSIS — N1831 Chronic kidney disease, stage 3a: Secondary | ICD-10-CM

## 2022-11-20 NOTE — Telephone Encounter (Signed)
I referred her to you I think.  Can you help?  She's medicare so no idea how a card will benefit her

## 2022-11-20 NOTE — Telephone Encounter (Signed)
I just sent her a my chart with the "one time use farxiga copay card for medicare" She can use at the pharmacy to get 30day free Can you put in referral to me? I didn't see one yet! Thanks!

## 2022-11-24 ENCOUNTER — Other Ambulatory Visit: Payer: Self-pay | Admitting: Family Medicine

## 2022-11-24 DIAGNOSIS — I10 Essential (primary) hypertension: Secondary | ICD-10-CM

## 2022-11-25 ENCOUNTER — Telehealth: Payer: Self-pay

## 2022-11-25 NOTE — Progress Notes (Unsigned)
Care Pitts Note  11/25/2022 Name: Vanessa Pitts MRN: [id number] DOB: [birth year]  Referred by: Vanessa Ip, DO Reason for referral : Care Coordination (Outreach to schedule with Pharm d )   Vanessa Pitts is a 77 y.o. year old female who is a primary care patient of Vanessa Ip, DO. Vanessa Pitts was referred to the pharmacist for assistance related to CKD Stage 3 .    An unsuccessful telephone outreach was attempted today to contact the patient who was referred to the pharmacy team for assistance with medication assistance. Additional attempts will be made to contact the patient.   Vanessa Pitts, Vanessa Pitts [facility]  Vanessa Pitts, [place] [address] Direct Dial: [phone] [email or web address]

## 2022-11-30 NOTE — Progress Notes (Unsigned)
Care Guide Note  11/30/2022 Name: Denzel Bacon MRN: [id number] DOB: May 27, 1945  Referred by: [name], DO Reason for referral : Care Coordination (Outreach to schedule with Pharm d )   Anicia Grunder is a 77 y.o. year old female who is a primary care patient of [name], DO. Laurna Plaza was referred to the pharmacist for assistance related to CKD Stage 3 .    A second unsuccessful telephone outreach was attempted today to contact the patient who was referred to the pharmacy team for assistance with medication management. Additional attempts will be made to contact the patient.  Penne Lash, RMA Care Guide [facility]  [place], [place] [address] Direct Dial: [phone] [email or web address]

## 2022-12-02 NOTE — Progress Notes (Signed)
Care Guide Note  12/02/2022 Name: Vanessa Pitts MRN: [id number] DOB: March 13, 1945  Referred by: [name], DO Reason for referral : Care Coordination (Outreach to schedule with Pharm d )   Vanessa Pitts is a 77 y.o. year old female who is a primary care patient of [name], DO. Karyne Cunniff was referred to the pharmacist for assistance related to CKD Stage   .    A third unsuccessful telephone outreach was attempted today to contact the patient who was referred to the pharmacy team for assistance with medication management. The Population Health team is pleased to engage with this patient at any time in the future upon receipt of referral and should he/she be interested in assistance from the [facility] team.   Penne Lash, RMA Care Guide [facility]  [place], [place] [address] Direct Dial: [phone] [email or web address]

## 2022-12-09 ENCOUNTER — Other Ambulatory Visit: Payer: Self-pay | Admitting: Family Medicine

## 2022-12-09 DIAGNOSIS — J3089 Other allergic rhinitis: Secondary | ICD-10-CM

## 2022-12-11 ENCOUNTER — Telehealth: Payer: Self-pay | Admitting: Family Medicine

## 2022-12-11 NOTE — Telephone Encounter (Signed)
Pt says that she cannot use coupon card for FARXIGA 5 MG TABS tablet  because she is on [facility] and she has PLAN D. Pt is asking for rx that insurance will pay. Use [facility].

## 2022-12-11 NOTE — Telephone Encounter (Signed)
help

## 2022-12-15 ENCOUNTER — Telehealth: Payer: Self-pay | Admitting: Pharmacist

## 2022-12-15 NOTE — Telephone Encounter (Signed)
Call placed to patient  No answer and no VM set up Pharmacy has unsuccessfully reached out x3 to patient re: Farxiga/patient assistance for CKD

## 2023-01-04 ENCOUNTER — Other Ambulatory Visit: Payer: Self-pay | Admitting: Family Medicine

## 2023-01-04 DIAGNOSIS — J3089 Other allergic rhinitis: Secondary | ICD-10-CM

## 2023-01-18 ENCOUNTER — Telehealth: Payer: Self-pay | Admitting: Family Medicine

## 2023-01-18 NOTE — Telephone Encounter (Signed)
Can Dr Nadine Counts advise on this.   Copied from CRM [id number]. Topic: Clinical - Request for Lab/Test Order >> Jan 18, 2023  2:13 PM Tiffany H wrote: Reason for CRM: Patient called to request that Dr. Nadine Counts submit orders for imaging required for patient to have injections in knee. She would like these orders sent to:   [facility] AT [address].   Please assist. Patient is hoping to have injections done by Christmas.

## 2023-01-18 NOTE — Telephone Encounter (Signed)
Sent!

## 2023-01-18 NOTE — Telephone Encounter (Signed)
Rx completed and given to [facility] to fax today

## 2023-01-21 ENCOUNTER — Telehealth: Payer: Self-pay | Admitting: Family Medicine

## 2023-01-21 NOTE — Telephone Encounter (Signed)
Please review

## 2023-01-21 NOTE — Telephone Encounter (Signed)
Copied from CRM [id number]. Topic: General - Other >> Jan 21, 2023 12:49 PM Larwance Sachs wrote: Reason for CRM: Patient called in to advised [facility] imaging stated they have not received any information regarding knee injection patient needs. Patient asked the information be re-sent to [facility] imaging due to really needing injection, please call patient back to update

## 2023-01-22 NOTE — Telephone Encounter (Signed)
This is NOT a knee injection. This is for her back. Kelci, please refax rx I gave you and call and confirm receipt.  This patient needs this ASAP.

## 2023-01-22 NOTE — Telephone Encounter (Signed)
Done

## 2023-01-25 ENCOUNTER — Encounter: Payer: Self-pay | Admitting: Family Medicine

## 2023-01-25 ENCOUNTER — Other Ambulatory Visit: Payer: Self-pay | Admitting: Family Medicine

## 2023-01-25 DIAGNOSIS — M4306 Spondylolysis, lumbar region: Secondary | ICD-10-CM

## 2023-01-26 ENCOUNTER — Other Ambulatory Visit: Payer: Self-pay | Admitting: Family Medicine

## 2023-01-26 DIAGNOSIS — I10 Essential (primary) hypertension: Secondary | ICD-10-CM

## 2023-01-26 DIAGNOSIS — N1831 Chronic kidney disease, stage 3a: Secondary | ICD-10-CM

## 2023-02-06 ENCOUNTER — Other Ambulatory Visit: Payer: Self-pay | Admitting: Family Medicine

## 2023-02-08 ENCOUNTER — Telehealth: Payer: Self-pay | Admitting: Family Medicine

## 2023-02-08 ENCOUNTER — Encounter: Payer: Self-pay | Admitting: Family Medicine

## 2023-02-08 MED ORDER — PRAVASTATIN SODIUM 20 MG PO TABS: 20.0000 mg | ORAL_TABLET | Freq: Every day | ORAL | 0 refills | Status: DC

## 2023-02-08 MED ORDER — ALENDRONATE SODIUM 70 MG PO TABS: ORAL_TABLET | ORAL | 0 refills | Status: DC

## 2023-02-08 NOTE — Telephone Encounter (Signed)
I tried to call pt to let them know that they need an appt to get two meds refilled. Last ov was 11-04-2022 w/Dr G & they gave her 30 days on 01-04-2023. Please make an appt. I was not able to leave a message. Also, I sent pt a letter about this!

## 2023-02-08 NOTE — Telephone Encounter (Signed)
[place] pt NTBS 30-d given 01/04/23

## 2023-02-08 NOTE — Addendum Note (Signed)
Addended by: Julious Payer D on: 02/08/2023 01:01 PM   Modules accepted: Orders

## 2023-02-17 NOTE — Discharge Instructions (Signed)

## 2023-02-18 ENCOUNTER — Ambulatory Visit
Admission: RE | Admit: 2023-02-18 | Discharge: 2023-02-18 | Disposition: A | Discharge status: Home / Self Care | Payer: Medicare Other | Source: Ambulatory Visit | Attending: Family Medicine | Admitting: Family Medicine

## 2023-02-18 DIAGNOSIS — M4306 Spondylolysis, lumbar region: Secondary | ICD-10-CM

## 2023-02-18 MED ORDER — METHYLPREDNISOLONE ACETATE 40 MG/ML INJ SUSP (RADIOLOG
80.0000 mg | Freq: Once | INTRAMUSCULAR | Status: AC
  Administered 2023-02-18: 80 mg via EPIDURAL

## 2023-02-18 MED ORDER — IOPAMIDOL (ISOVUE-M 200) INJECTION 41%
1.0000 mL | Freq: Once | INTRAMUSCULAR | Status: AC
  Administered 2023-02-18: 1 mL via EPIDURAL

## 2023-02-25 ENCOUNTER — Other Ambulatory Visit: Payer: Self-pay | Admitting: Family Medicine

## 2023-02-25 DIAGNOSIS — I10 Essential (primary) hypertension: Secondary | ICD-10-CM

## 2023-03-29 ENCOUNTER — Encounter: Payer: Self-pay | Admitting: Family Medicine

## 2023-03-29 ENCOUNTER — Ambulatory Visit (INDEPENDENT_AMBULATORY_CARE_PROVIDER_SITE_OTHER): Payer: Medicare Other | Admitting: Family Medicine

## 2023-03-29 VITALS — BP 152/82 | HR 85 | Ht 67.0 in | Wt 154.0 lb

## 2023-03-29 DIAGNOSIS — N1831 Chronic kidney disease, stage 3a: Secondary | ICD-10-CM | POA: Diagnosis not present

## 2023-03-29 DIAGNOSIS — I10 Essential (primary) hypertension: Secondary | ICD-10-CM | POA: Diagnosis not present

## 2023-03-29 DIAGNOSIS — J3089 Other allergic rhinitis: Secondary | ICD-10-CM

## 2023-03-29 DIAGNOSIS — M810 Age-related osteoporosis without current pathological fracture: Secondary | ICD-10-CM

## 2023-03-29 MED ORDER — ALENDRONATE SODIUM 70 MG PO TABS: ORAL_TABLET | ORAL | 3 refills | Status: DC

## 2023-03-29 MED ORDER — MONTELUKAST SODIUM 10 MG PO TABS: 10.0000 mg | ORAL_TABLET | Freq: Every day | ORAL | 3 refills | Status: DC

## 2023-03-29 MED ORDER — AMLODIPINE BESYLATE 5 MG PO TABS: 7.5000 mg | ORAL_TABLET | Freq: Every day | ORAL | 0 refills | Status: DC

## 2023-03-29 MED ORDER — TELMISARTAN 20 MG PO TABS: 20.0000 mg | ORAL_TABLET | Freq: Every day | ORAL | 3 refills | Status: DC

## 2023-03-29 MED ORDER — AMLODIPINE BESYLATE 5 MG PO TABS: 7.5000 mg | ORAL_TABLET | Freq: Every day | ORAL | 3 refills | Status: DC

## 2023-03-29 MED ORDER — PANTOPRAZOLE SODIUM 40 MG PO TBEC: 40.0000 mg | DELAYED_RELEASE_TABLET | Freq: Every day | ORAL | 3 refills | Status: DC

## 2023-03-29 MED ORDER — PRAVASTATIN SODIUM 20 MG PO TABS: 20.0000 mg | ORAL_TABLET | Freq: Every day | ORAL | 3 refills | Status: DC

## 2023-03-29 MED ORDER — FARXIGA 5 MG PO TABS: 5.0000 mg | ORAL_TABLET | Freq: Every day | ORAL | 3 refills | Status: DC

## 2023-03-29 MED ORDER — AMLODIPINE BESYLATE 5 MG PO TABS: 5.0000 mg | ORAL_TABLET | Freq: Every day | ORAL | 0 refills | Status: DC

## 2023-03-29 NOTE — Patient Instructions (Signed)
Hold Fosamax until May.  Then can resume for bones Referred to Raynelle Fanning for [place] assistance, she should call soon to discuss.

## 2023-03-29 NOTE — Progress Notes (Signed)
Subjective: CC:HTN, CKD, osteoporosis PCP: Vanessa Ip, DO NFA:OZHY Vanessa Pitts is a 77 y.o. female presenting to clinic today for:  1. HTN associated with CKD3a Compliant with medications.  She has very few [place] available to her.  She states that she is seeing blood pressures anywhere between 140s and 150s at home.  She reports no chest pain, shortness of breath.  Had some left ankle swelling after an injury but that is since resolved.  2. Osteoporosis Has been on Fosamax since 04-23-2016.  Due for DEXA scan.  She feels like she has strengthen her bones and tries to keep up with activity as tolerated.  She reports no bony pain.   ROS: Per HPI  No Known Allergies Past Medical History:  Diagnosis Date   Chronically dry eyes    Hyperlipidemia    Hypertension    Leg pain, left    Osteoporosis    Ovarian cyst     Current Outpatient Medications:    alendronate (FOSAMAX) 70 MG tablet, TAKE 1 TABLET BY MOUTH ONCE A WEEK WITH A FULL GLASS OF WATER ON AN EMPTY STOMACH, Disp: 12 tablet, Rfl: 0   amLODipine (NORVASC) 5 MG tablet, Take 1 tablet by mouth once daily for blood pressure, Disp: 90 tablet, Rfl: 0   FARXIGA 5 MG TABS tablet, Take 1 tablet (5 mg total) by mouth daily., Disp: 30 tablet, Rfl: 2   fluticasone (FLONASE) 50 MCG/ACT nasal spray, USE 1 SPRAY(S) IN EACH NOSTRIL TWICE DAILY AS NEEDED FOR ALLERGIES OR  RHINITIS, Disp: 16 g, Rfl: 0   montelukast (SINGULAIR) 10 MG tablet, TAKE 1 TABLET BY MOUTH AT BEDTIME FOR ALLERGIES, Disp: 90 tablet, Rfl: 0   pantoprazole (PROTONIX) 40 MG tablet, Take 1 tablet (40 mg total) by mouth daily., Disp: 90 tablet, Rfl: 3   Plecanatide (TRULANCE) 3 MG TABS, Take 1 tablet daily for constipation, Disp: , Rfl:    pravastatin (PRAVACHOL) 20 MG tablet, Take 1 tablet (20 mg total) by mouth daily., Disp: 30 tablet, Rfl: 0   telmisartan (MICARDIS) 20 MG tablet, Take 1 tablet (20 mg total) by mouth daily. **NEEDS TO BE SEEN BEFORE NEXT REFILL**, Disp: 30  tablet, Rfl: 0   tiZANidine (ZANAFLEX) 4 MG tablet, Take 1 tablet (4 mg total) by mouth every 8 (eight) hours as needed for muscle spasms., Disp: 30 tablet, Rfl: 12   traMADol (ULTRAM) 50 MG tablet, Take 1 tablet (50 mg total) by mouth every 12 (twelve) hours as needed for moderate pain or severe pain., Disp: 60 tablet, Rfl: 1   triamcinolone cream (KENALOG) 0.1 %, Apply scant amount to finger and rub along ear canal twice daily for 1 week as needed for itching/ dermatitis, Disp: 30 g, Rfl: 0   vitamin B-12 (CYANOCOBALAMIN) 500 MCG tablet, 500 mcg., Disp: , Rfl:  Social History   Socioeconomic History   Marital status: Widowed    Spouse name: Shirlee Limerick   Number of children: 3   Years of education: 13   Highest education level: Some college, no degree  Occupational History   Occupation: retired  Tobacco Use   Smoking status: Never   Smokeless tobacco: Never  Substance and Sexual Activity   Alcohol use: No   Drug use: No   Sexual activity: Yes    Birth control/protection: Post-menopausal  Other Topics Concern   Not on file  Social History Narrative   Husband passed away in 04-24-2019.   Pt lives with daughter.1 son and 2 daughters.  Social Drivers of Corporate investment banker Strain: Low Risk  (03/31/2022)   Overall Financial Resource Strain (CARDIA)    Difficulty of Paying Living Expenses: Not hard at all  Food Insecurity: No Food Insecurity (03/31/2022)   Hunger Vital Sign    Worried About Running Out of Food in the Last Year: Never true    Ran Out of Food in the Last Year: Never true  Transportation Needs: No Transportation Needs (03/31/2022)   PRAPARE - Administrator, Civil Service (Medical): No    Lack of Transportation (Non-Medical): No  Physical Activity: Insufficiently Active (03/31/2022)   Exercise Vital Sign    Days of Exercise per Week: 3 days    Minutes of Exercise per Session: 30 min  Stress: No Stress Concern Present (03/31/2022)   [employer] of  Occupational Health - Occupational Stress Questionnaire    Feeling of Stress : Not at all  Social Connections: Socially Isolated (03/31/2022)   Social Connection and Isolation Panel [NHANES]    Frequency of Communication with Friends and Family: More than three times a week    Frequency of Social Gatherings with Friends and Family: More than three times a week    Attends Religious Services: Never    Database administrator or Organizations: No    Attends Banker Meetings: Never    Marital Status: Widowed  Intimate Partner Violence: Not At Risk (03/31/2022)   Humiliation, Afraid, Rape, and Kick questionnaire    Fear of Current or Ex-Partner: No    Emotionally Abused: No    Physically Abused: No    Sexually Abused: No   Family History  Problem Relation Age of Onset   Heart disease Mother    Diabetes Mother    Hyperlipidemia Mother    Hypertension Mother    Arthritis Mother    Obesity Mother    Arthritis Father     Objective: Office vital signs reviewed. BP (!) 152/82   Pulse 85   Ht 5\' 7"  (1.702 m)   Wt 154 lb (69.9 kg)   SpO2 97%   BMI 24.12 kg/m   Physical Examination:  General: Awake, alert, well nourished, No acute distress HEENT: sclera white, MMM Cardio: regular rate and rhythm, S1S2 heard, no murmurs appreciated Pulm: clear to auscultation bilaterally, no wheezes, rhonchi or rales; normal work of breathing on room air  Assessment/ Plan: 78 y.o. female   Stage 3a chronic kidney disease (HCC) - Plan: Renal Function Panel, telmisartan (MICARDIS) 20 MG tablet, FARXIGA 5 MG TABS tablet, AMB Referral [facility] Care Management  Essential hypertension - Plan: Renal Function Panel, telmisartan (MICARDIS) 20 MG tablet, amLODipine (NORVASC) 5 MG tablet, DISCONTINUED: amLODipine (NORVASC) 5 MG tablet, DISCONTINUED: amLODipine (NORVASC) 5 MG tablet  Age-related osteoporosis without current pathological fracture  Non-seasonal allergic rhinitis, unspecified trigger -  Plan: montelukast (SINGULAIR) 10 MG tablet  I am advancing her amlodipine to 7.5 mg daily since blood pressure is not at goal.  Keep my Cardis at current dose.  I have placed a referral to patient assistance clinical pharmacist for Farxiga and given her 3 weeks of samples today.  Hopefully this will get her in until she can be seen by Raynelle Fanning.  I placed the copy of her so security check on Julie's desk today for reference and assistance with forms  I we will get her DEXA scan completed at next visit as this was not available during today's office visit  Did not discuss allergic  rhinitis but needed refills on Singulair so this was sent   Vanessa Ip, DO [facility] [phone]

## 2023-03-30 LAB — RENAL FUNCTION PANEL
Albumin: 4.7 g/dL (ref 3.8–4.8)
BUN/Creatinine Ratio: 16 (ref 12–28)
BUN: 17 mg/dL (ref 8–27)
CO2: 22 mmol/L (ref 20–29)
Calcium: 9.6 mg/dL (ref 8.7–10.3)
Chloride: 101 mmol/L (ref 96–106)
Creatinine, Ser: 1.09 mg/dL — ABNORMAL HIGH (ref 0.57–1.00)
Glucose: 114 mg/dL — ABNORMAL HIGH (ref 70–99)
Phosphorus: 3.2 mg/dL (ref 3.0–4.3)
Potassium: 4.3 mmol/L (ref 3.5–5.2)
Sodium: 140 mmol/L (ref 134–144)
eGFR: 52 mL/min/{1.73_m2} — ABNORMAL LOW (ref >59)

## 2023-03-31 ENCOUNTER — Telehealth: Payer: Self-pay

## 2023-03-31 NOTE — Progress Notes (Signed)
Care Guide Pharmacy Note  03/31/2023 Name: Vanessa Pitts MRN: [id number] DOB: 22-Jan-1946  Referred By: [name], DO Reason for referral: Care Coordination (Outreach to schedule with pharm d )   Vanessa Pitts is a 78 y.o. year old female who is a primary care patient of [name], DO.  Vanessa Pitts was referred to the pharmacist for assistance related to: HTN and HLD  Successful contact was made with the patient to discuss pharmacy services including being ready for the pharmacist to call at least 5 minutes before the scheduled appointment time and to have medication bottles and any blood pressure readings ready for review. The patient agreed to meet with the pharmacist via telephone visit on (date/time).04/29/2023  Penne Lash , RMA     [place]  [facility] Guide  Direct Dial: [phone]  Website: [email or web address]

## 2023-04-29 ENCOUNTER — Other Ambulatory Visit (INDEPENDENT_AMBULATORY_CARE_PROVIDER_SITE_OTHER): Payer: Medicare Other

## 2023-04-29 DIAGNOSIS — N1831 Chronic kidney disease, stage 3a: Secondary | ICD-10-CM

## 2023-04-29 NOTE — Progress Notes (Signed)
 04/29/2023 Name: Vanessa Pitts MRN: [id number] DOB: March 08, 1945  Chief Complaint  Patient presents with   Medication Management   Chronic Kidney Disease    Vanessa Pitts    Vanessa Pitts is a 78 y.o. year old female who presented for a telephone visit.   They were referred to the pharmacist by their PCP for assistance in managing medication access.    Subjective:  Patient seeking assistance for SGLT2 for CKD  Care Team: Primary Care Provider: [name], DO ;   Medication Access/Adherence  Current Pharmacy:  [id number] - [place], [place] - [address] [place] [address] Phone: [phone] Fax: [phone]   Patient reports affordability concerns with their medications: Yes  Patient reports access/transportation concerns to their pharmacy: No  Patient reports adherence concerns with their medications:  No    Chronic Kidney Disease -PAP needed for Farxiga for CKD3 GFR 30-59   Objective:  No results found for: "HGBA1C"  Lab Results  Component Value Date   CREATININE 1.09 (H) 03/29/2023   BUN 17 03/29/2023   NA 140 03/29/2023   K 4.3 03/29/2023   CL 101 03/29/2023   CO2 22 03/29/2023    Lab Results  Component Value Date   CHOL 161 01/17/2021   HDL 71 01/17/2021   LDLCALC 78 01/17/2021   LDLDIRECT 83 11/25/2021   TRIG 62 01/17/2021   CHOLHDL 2.3 01/17/2021    Medications Reviewed Today     Reviewed by Danella Maiers, RPH (Pharmacist) on 05/18/23 at 1632  Med List Status: <None>   Medication Order Taking? Sig Documenting Provider Last Dose Status Informant  alendronate (FOSAMAX) 70 MG tablet [id number]  TAKE 1 TABLET BY MOUTH ONCE A WEEK WITH A FULL GLASS OF WATER ON AN EMPTY STOMACH Delynn Flavin M, DO  Active   amLODipine (NORVASC) 5 MG tablet [id number]  Take 1.5 tablets (7.5 mg total) by mouth daily. for blood pressure (NOTE DOSE CHANGE!) Delynn Flavin M, DO  Active   dapagliflozin propanediol  (FARXIGA) 10 MG TABS tablet [id number] Yes Take 1 tablet (10 mg total) by mouth daily before breakfast. Delynn Flavin M, DO  Active   fluticasone (FLONASE) 50 MCG/ACT nasal spray [id number] No USE 1 SPRAY(S) IN EACH NOSTRIL TWICE DAILY AS NEEDED FOR ALLERGIES OR  RHINITIS Gottschalk, [name], DO Taking Active   montelukast (SINGULAIR) 10 MG tablet [id number]  Take 1 tablet (10 mg total) by mouth at bedtime. Delynn Flavin M, DO  Active   pantoprazole (PROTONIX) 40 MG tablet [id number]  Take 1 tablet (40 mg total) by mouth daily. Delynn Flavin M, DO  Active   pravastatin (PRAVACHOL) 20 MG tablet [id number]  Take 1 tablet (20 mg total) by mouth daily. Delynn Flavin M, DO  Active   telmisartan (MICARDIS) 20 MG tablet [id number]  Take 1 tablet (20 mg total) by mouth daily. Delynn Flavin M, DO  Active   tiZANidine (ZANAFLEX) 4 MG tablet [id number] No Take 1 tablet (4 mg total) by mouth every 8 (eight) hours as needed for muscle spasms. Delynn Flavin M, DO Taking Active   traMADol (ULTRAM) 50 MG tablet [id number] No Take 1 tablet (50 mg total) by mouth every 12 (twelve) hours as needed for moderate pain or severe pain. Delynn Flavin M, DO Taking Active   triamcinolone cream (KENALOG) 0.1 % [id number] No Apply scant amount to finger and rub along ear canal twice daily for 1 week as needed for itching/ dermatitis Delynn Flavin M,  DO Taking Active   vitamin B-12 (CYANOCOBALAMIN) 500 MCG tablet [id number] No 500 mcg. [provider] Taking Active               Assessment/Plan:   -[place] PAP application mailed to patients home -will increase to farxiga 10mg  for full CKD protection; patient tolerating (escribed to [facility] mail order pharmacy) -continue all other meds as prescribed  Follow Up Plan: 4 weeks  30 min of patient care was provided to the patient during this visit time. No charge visit  Kieth Brightly, PharmD, BCACP, CPP Clinical Pharmacist, [facility]

## 2023-05-10 ENCOUNTER — Telehealth: Payer: Self-pay

## 2023-05-10 NOTE — Telephone Encounter (Signed)
 Copied from CRM [id number]. Topic: General - Other >> May 10, 2023  3:59 PM Vanessa Pitts wrote: Reason for CRM: The patient states she spoke to [facility] Raynelle Fanning Pruitt regarding being mailed a [place] assistance application on 3/20 and has not received it. The patient's call back number is [phone].

## 2023-05-14 ENCOUNTER — Telehealth: Payer: Self-pay | Admitting: Pharmacist

## 2023-05-14 DIAGNOSIS — N1831 Chronic kidney disease, stage 3a: Secondary | ICD-10-CM

## 2023-05-14 NOTE — Telephone Encounter (Signed)
   Patient needs to enroll in the AZ&me patient assistance program for Farxiga.  Patient is stable on current regimen.   Kieth Brightly, PharmD, BCACP, CPP Clinical Pharmacist, [facility]

## 2023-05-14 NOTE — Telephone Encounter (Deleted)
 No record of patient assistance  Reached out in 2024 with no return call x3 Will put in new referral 2304 to pharmacy

## 2023-05-14 NOTE — Telephone Encounter (Signed)
 AZ&me PAP request sent to [facility]

## 2023-05-14 NOTE — Addendum Note (Signed)
 Addended by: Vanice Sarah D on: 05/14/2023 12:07 PM   Modules accepted: Orders

## 2023-05-17 ENCOUNTER — Telehealth: Payer: Self-pay

## 2023-05-17 DIAGNOSIS — N1831 Chronic kidney disease, stage 3a: Secondary | ICD-10-CM

## 2023-05-17 NOTE — Progress Notes (Signed)
 Pharmacy Medication Assistance Program Note    06/15/2023  Patient ID: Vanessa Pitts, female   DOB: 08/06/1945, 78 y.o.   MRN: [id number]     05/17/2023  Outreach Medication One  Manufacturer Medication [employer]  [employer] [facility] Farxiga   Type of Radiographer, therapeutic Assistance  Date Application Sent to Patient 05/18/2023  Application Items Requested Application;Proof of Income  Date Application Received From Patient 06/15/2023  Date Application Submitted to Manufacturer 06/15/2023     NEW  - submitted  Please send a new 90 day RX of Farxiga  to [facility] pharmacy for patients assistance enrollment. Thanks!

## 2023-05-17 NOTE — Telephone Encounter (Signed)
 Attempted to contact patient regarding assistance. No voicemail available. Will mail application to patients home.

## 2023-05-18 MED ORDER — DAPAGLIFLOZIN PROPANEDIOL 10 MG PO TABS: 10.0000 mg | ORAL_TABLET | Freq: Every day | ORAL | 4 refills | Status: DC

## 2023-06-01 ENCOUNTER — Telehealth: Payer: Self-pay | Admitting: Family Medicine

## 2023-06-01 NOTE — Telephone Encounter (Signed)
 Rx given to [facility] to fax.

## 2023-06-01 NOTE — Telephone Encounter (Signed)
 Copied from CRM [id number]. Topic: General - Other >> Jun 01, 2023  2:09 PM Alpha Arts wrote: Reason for CRM: Patient is requesting that Vicky Grange send her information [facility], Dr. Laurie Poplar so that she may get her injections in her legs  Callback #: [phone]

## 2023-06-09 ENCOUNTER — Telehealth: Payer: Self-pay | Admitting: Family Medicine

## 2023-06-09 NOTE — Telephone Encounter (Signed)
 Copied from CRM [id number]. Topic: Clinical - Medical Advice >> Jun 09, 2023  1:52 PM Vanessa Pitts F wrote: Reason for CRM:   Patient called in stating that she requires her PCP to send over an RX for her injections for Dr. Laurie Poplar to perform at [facility];. Patient stated that the pinched nerve in her back  is beginning to bother her and she is not able to walk a long distance without having to take a break; Last injection was in January and patient would like the new injection before May 20th as she is scheduled to go one vacation after that. Patient was informed by [facility] today (06/09/2023) that they still do not have the RX for the injection that was suppose to be sent over on 06/01/2023 as instructed by the patient's PCP. Please review the fax log and send the rx for injection a second time if necessary.  Please call the patient back with an update. Patient was given a today or tomorrow turn around for callback.  Callback Number: [phone]

## 2023-06-11 ENCOUNTER — Other Ambulatory Visit: Payer: Self-pay | Admitting: Family Medicine

## 2023-06-11 ENCOUNTER — Telehealth: Payer: Self-pay

## 2023-06-11 DIAGNOSIS — M4306 Spondylolysis, lumbar region: Secondary | ICD-10-CM

## 2023-06-11 NOTE — Telephone Encounter (Signed)
 Copied from CRM [id number]. Topic: General - Other >> Jun 11, 2023  9:20 AM Jyl Or S wrote: Reason for CRM: Need office notes before giving  epidural injection Bronwen Canon [phone]

## 2023-06-11 NOTE — Telephone Encounter (Signed)
 This has been faxed.

## 2023-06-11 NOTE — Telephone Encounter (Signed)
Please refer to previous message

## 2023-06-15 MED ORDER — DAPAGLIFLOZIN PROPANEDIOL 10 MG PO TABS: 10.0000 mg | ORAL_TABLET | Freq: Every day | ORAL | 4 refills | Status: DC

## 2023-06-15 NOTE — Telephone Encounter (Signed)
 Done

## 2023-06-18 NOTE — Discharge Instructions (Addendum)

## 2023-06-21 ENCOUNTER — Other Ambulatory Visit

## 2023-06-22 ENCOUNTER — Ambulatory Visit
Admission: RE | Admit: 2023-06-22 | Discharge: 2023-06-22 | Disposition: A | Discharge status: Home / Self Care | Source: Ambulatory Visit | Attending: Family Medicine

## 2023-06-22 DIAGNOSIS — M4306 Spondylolysis, lumbar region: Secondary | ICD-10-CM

## 2023-06-22 MED ORDER — IOPAMIDOL (ISOVUE-M 200) INJECTION 41%
1.0000 mL | Freq: Once | INTRAMUSCULAR | Status: AC
  Administered 2023-06-22: 1 mL via EPIDURAL

## 2023-06-22 MED ORDER — METHYLPREDNISOLONE ACETATE 40 MG/ML INJ SUSP (RADIOLOG
80.0000 mg | Freq: Once | INTRAMUSCULAR | Status: AC
  Administered 2023-06-22: 80 mg via EPIDURAL

## 2023-07-07 ENCOUNTER — Ambulatory Visit: Payer: Medicare Other

## 2023-07-23 ENCOUNTER — Ambulatory Visit (INDEPENDENT_AMBULATORY_CARE_PROVIDER_SITE_OTHER)

## 2023-07-23 VITALS — BP 152/82 | HR 85 | Ht 67.0 in | Wt 154.0 lb

## 2023-07-23 DIAGNOSIS — Z0000 Encounter for general adult medical examination without abnormal findings: Secondary | ICD-10-CM

## 2023-07-23 DIAGNOSIS — Z13820 Encounter for screening for osteoporosis: Secondary | ICD-10-CM

## 2023-07-23 NOTE — Progress Notes (Signed)
 Subjective:   Vanessa Pitts is a 78 y.o. who presents for a Medicare Wellness preventive visit.  As a reminder, Annual Wellness Visits don't include a physical exam, and some assessments may be limited, especially if this visit is performed virtually. We may recommend an in-person follow-up visit with your provider if needed.  Visit Complete: Virtual I connected with  Vanessa Pitts on 07/23/23 by a audio enabled telemedicine application and verified that I am speaking with the correct person using two identifiers.  Patient Location: Home  Provider Location: Home Office  I discussed the limitations of evaluation and management by telemedicine. The patient expressed understanding and agreed to proceed.  Vital Signs: Because this visit was a virtual/telehealth visit, some criteria may be missing or patient reported. Any vitals not documented were not able to be obtained and vitals that have been documented are patient reported.  VideoDeclined- This patient declined Librarian, academic. Therefore the visit was completed with audio only.  Persons Participating in Visit: Patient.  AWV Questionnaire: No: Patient Medicare AWV questionnaire was not completed prior to this visit.  Cardiac Risk Factors include: advanced age (>78men, >80 women);dyslipidemia;hypertension     Objective:    Today's Vitals   07/23/23 0848  BP: (!) 152/82  Pulse: 85  Weight: 154 lb (69.9 kg)  Height: 5' 7 (1.702 m)   Body mass index is 24.12 kg/m.     07/23/2023    9:02 AM 03/31/2022   10:40 AM 01/27/2021   10:51 AM 09/13/2018   10:16 AM  Advanced Directives  Does Patient Have a Medical Advance Directive? No No No No  Would patient like information on creating a medical advance directive?  No - Patient declined No - Patient declined No - Patient declined      Data saved with a previous flowsheet row definition    Current Medications (verified) Outpatient Encounter Medications  as of 07/23/2023  Medication Sig   alendronate  (FOSAMAX ) 70 MG tablet TAKE 1 TABLET BY MOUTH ONCE A WEEK WITH A FULL GLASS OF WATER ON AN EMPTY STOMACH   amLODipine  (NORVASC ) 5 MG tablet Take 1.5 tablets (7.5 mg total) by mouth daily. for blood pressure (NOTE DOSE CHANGE!)   dapagliflozin  propanediol (FARXIGA ) 10 MG TABS tablet Take 1 tablet (10 mg total) by mouth daily before breakfast.   fluticasone  (FLONASE ) 50 MCG/ACT nasal spray USE 1 SPRAY(S) IN EACH NOSTRIL TWICE DAILY AS NEEDED FOR ALLERGIES OR  RHINITIS   montelukast  (SINGULAIR ) 10 MG tablet Take 1 tablet (10 mg total) by mouth at bedtime.   pantoprazole  (PROTONIX ) 40 MG tablet Take 1 tablet (40 mg total) by mouth daily.   pravastatin  (PRAVACHOL ) 20 MG tablet Take 1 tablet (20 mg total) by mouth daily.   telmisartan  (MICARDIS ) 20 MG tablet Take 1 tablet (20 mg total) by mouth daily.   tiZANidine  (ZANAFLEX ) 4 MG tablet Take 1 tablet (4 mg total) by mouth every 8 (eight) hours as needed for muscle spasms.   traMADol  (ULTRAM ) 50 MG tablet Take 1 tablet (50 mg total) by mouth every 12 (twelve) hours as needed for moderate pain or severe pain.   triamcinolone  cream (KENALOG ) 0.1 % Apply scant amount to finger and rub along ear canal twice daily for 1 week as needed for itching/ dermatitis   vitamin B-12 (CYANOCOBALAMIN) 500 MCG tablet 500 mcg.   No facility-administered encounter medications on file as of 07/23/2023.    Allergies (verified) Patient has no known allergies.  History: Past Medical History:  Diagnosis Date   Chronically dry eyes    Hyperlipidemia    Hypertension    Leg pain, left    Osteoporosis    Ovarian cyst    Past Surgical History:  Procedure Laterality Date   cataract Bilateral 08-15-2015   Family History  Problem Relation Age of Onset   Heart disease Mother    Diabetes Mother    Hyperlipidemia Mother    Hypertension Mother    Arthritis Mother    Obesity Mother    Arthritis Father    Social History    Socioeconomic History   Marital status: Widowed    Spouse name: Benancio Bracket   Number of children: 3   Years of education: 13   Highest education level: Some college, no degree  Occupational History   Occupation: retired  Tobacco Use   Smoking status: Never   Smokeless tobacco: Never  Substance and Sexual Activity   Alcohol use: No   Drug use: No   Sexual activity: Yes    Birth control/protection: Post-menopausal  Other Topics Concern   Not on file  Social History Narrative   Husband passed away in August 15, 2019.   Pt lives with daughter.1 son and 2 daughters.    Social Drivers of Corporate investment banker Strain: Low Risk  (07/23/2023)   Overall Financial Resource Strain (CARDIA)    Difficulty of Paying Living Expenses: Not hard at all  Food Insecurity: No Food Insecurity (07/23/2023)   Hunger Vital Sign    Worried About Running Out of Food in the Last Year: Never true    Ran Out of Food in the Last Year: Never true  Transportation Needs: No Transportation Needs (07/23/2023)   PRAPARE - Administrator, Civil Service (Medical): No    Lack of Transportation (Non-Medical): No  Physical Activity: Insufficiently Active (07/23/2023)   Exercise Vital Sign    Days of Exercise per Week: 7 days    Minutes of Exercise per Session: 20 min  Stress: No Stress Concern Present (07/23/2023)   [employer] of Occupational Health - Occupational Stress Questionnaire    Feeling of Stress: Not at all  Social Connections: Socially Isolated (07/23/2023)   Social Connection and Isolation Panel    Frequency of Communication with Friends and Family: Once a week    Frequency of Social Gatherings with Friends and Family: Once a week    Attends Religious Services: Never    Database administrator or Organizations: No    Attends Banker Meetings: Never    Marital Status: Widowed    Tobacco Counseling Counseling given: Yes    Clinical Intake:  Pre-visit preparation completed:  Yes  Pain : No/denies pain     BMI - recorded: 24.12 Nutritional Status: BMI of 19-24  Normal Nutritional Risks: None Diabetes: No  No results found for: HGBA1C   How often do you need to have someone help you when you read instructions, pamphlets, or other written materials from your doctor or pharmacy?: 1 - Never  Interpreter Needed?: No  Information entered by :: Alia t/cma   Activities of Daily Living     07/23/2023    8:52 AM  In your present state of health, do you have any difficulty performing the following activities:  Hearing? 0  Comment pt uses hearing aids  Vision? 0  Comment pt denies vision dif/pt goes to [facility] in [place] ov 5/25  Difficulty concentrating or making decisions?  0  Walking or climbing stairs? 1  Dressing or bathing? 0  Doing errands, shopping? 0  Preparing Food and eating ? N  Using the Toilet? N  In the past six months, have you accidently leaked urine? N  Do you have problems with loss of bowel control? Y  Comment always constipated  Managing your Medications? N  Managing your Finances? N  Housekeeping or managing your Housekeeping? Y  Comment pt's daughter    Patient Care Team: Eliodoro Guerin, DO as PCP - General (Family Medicine)  I have updated your Care Teams any recent Medical Services you may have received from other providers in the past year.     Assessment:   This is a routine wellness examination for Vanessa Pitts.  Hearing/Vision screen Hearing Screening - Comments:: Pt wears hearing aids Vision Screening - Comments:: pt denies vision dif/pt goes to [facility] in [place] ov 5/25   Goals Addressed             This Visit's Progress    Patient Stated       Tried to be able to move/do more exercise        Depression Screen     07/23/2023    9:05 AM 03/29/2023    2:17 PM 11/04/2022    3:23 PM 08/24/2022    3:19 PM 03/31/2022   10:39 AM 11/25/2021    1:58 PM 01/27/2021    10:45 AM  PHQ 2/9 Scores  PHQ - 2 Score 0 1 0 2 0 0 0  PHQ- 9 Score 1 4 0 4       Fall Risk     07/23/2023    9:01 AM 11/04/2022    3:23 PM 08/24/2022    3:18 PM 04/15/2022    1:57 PM 03/31/2022   10:38 AM  Fall Risk   Falls in the past year? 1 0 0 0 0  Number falls in past yr: 0 0 0 0 0  Injury with Fall? 0 0 0 0 0  Risk for fall due to : Impaired balance/gait;Impaired mobility No Fall Risks No Fall Risks No Fall Risks No Fall Risks  Follow up Falls evaluation completed;Education provided;Falls prevention discussed Education provided Education provided Education provided Falls prevention discussed    MEDICARE RISK AT HOME:  Medicare Risk at Home Any stairs in or around the home?: Yes If so, are there any without handrails?: Yes Home free of loose throw rugs in walkways, pet beds, electrical cords, etc?: Yes Adequate lighting in your home to reduce risk of falls?: Yes Life alert?: No Use of a cane, walker or w/c?: No Grab bars in the bathroom?: No Shower chair or bench in shower?: No Elevated toilet seat or a handicapped toilet?: No  TIMED UP AND GO:  Was the test performed?  no  Cognitive Function: 6CIT completed        07/23/2023    9:06 AM 03/31/2022   10:41 AM 01/27/2021   10:55 AM 09/13/2018   10:19 AM  6CIT Screen  What Year? 0 points 0 points 0 points 0 points  What month? 0 points 0 points 0 points 0 points  What time? 0 points 0 points 0 points 0 points  Count back from 20 0 points 0 points 0 points 0 points  Months in reverse 2 points 0 points 0 points 0 points  Repeat phrase 2 points 0 points 4 points 2 points  Total Score 4 points 0 points 4 points  2 points    Immunizations Immunization History  Administered Date(s) Administered   Fluad Quad(high Dose 65+) 11/11/2018, 02/21/2020, 01/13/2021, 11/25/2021   Fluad Trivalent(High Dose 65+) 11/09/2022   Influenza, High Dose Seasonal PF 11/22/2017   Pneumococcal Conjugate-13 03/21/2018   Pneumococcal  Polysaccharide-23 06/07/2019   Tdap 04/15/2022    Screening Tests Health Maintenance  Topic Date Due   Zoster Vaccines- Shingrix (1 of 2) Never done   DEXA SCAN  08/28/2018   INFLUENZA VACCINE  09/10/2023   Medicare Annual Wellness (AWV)  07/22/2024   DTaP/Tdap/Td (2 - Td or Tdap) 04/14/2032   Pneumococcal Vaccine: 50+ Years  Completed   Hepatitis C Screening  Completed   HPV VACCINES  Aged Out   Meningococcal B Vaccine  Aged Out   COVID-19 Vaccine  Discontinued    Health Maintenance  Health Maintenance Due  Topic Date Due   Zoster Vaccines- Shingrix (1 of 2) Never done   DEXA SCAN  08/28/2018   Health Maintenance Items Addressed: See Nurse Notes at the end of this note  Additional Screening:  Vision Screening: Recommended annual ophthalmology exams for early detection of glaucoma and other disorders of the eye. Would you like a referral to an eye doctor? No    Dental Screening: Recommended annual dental exams for proper oral hygiene  Community Resource Referral / Chronic Care Management: CRR required this visit?  No   CCM required this visit?  No   Plan:    I have personally reviewed and noted the following in the patient's chart:   Medical and social history Use of alcohol, tobacco or illicit drugs  Current medications and supplements including opioid prescriptions. Patient is not currently taking opioid prescriptions. Functional ability and status Nutritional status Physical activity Advanced directives List of other physicians Hospitalizations, surgeries, and ER visits in previous 12 months Vitals Screenings to include cognitive, depression, and falls Referrals and appointments  In addition, I have reviewed and discussed with patient certain preventive protocols, quality metrics, and best practice recommendations. A written personalized care plan for preventive services as well as general preventive health recommendations were provided to  patient.   Michaelle Adolphus, CMA   07/23/2023   After Visit Summary: (MyChart) Due to this being a telephonic visit, the after visit summary with patients personalized plan was offered to patient via MyChart   Notes: Please refer to Routing Comments.

## 2023-07-23 NOTE — Patient Instructions (Signed)
 Ms. Vanessa Pitts , Thank you for taking time out of your busy schedule to complete your Annual Wellness Visit with me. I enjoyed our conversation and look forward to speaking with you again next year. I, as well as your care team,  appreciate your ongoing commitment to your health goals. Please review the following plan we discussed and let me know if I can assist you in the future. Your Game plan/ To Do List    Follow up Visits: Next Medicare AWV with our clinical staff: 07/25/24 at 8:40a.m.   Next Office Visit with your provider: n/a  Clinician Recommendations:  Aim for 30 minutes of exercise or brisk walking, 6-8 glasses of water, and 5 servings of fruits and vegetables each day. Please remember to make your appointment for Dexa Scan (bone density) and get your shingles vaccine updated at your next visit.       This is a list of the screening recommended for you and due dates:  Health Maintenance  Topic Date Due   Zoster (Shingles) Vaccine (1 of 2) Never done   DEXA scan (bone density measurement)  08/28/2018   Flu Shot  09/10/2023   Medicare Annual Wellness Visit  07/22/2024   DTaP/Tdap/Td vaccine (2 - Td or Tdap) 04/14/2032   Pneumococcal Vaccine for age over 10  Completed   Hepatitis C Screening  Completed   HPV Vaccine  Aged Out   Meningitis B Vaccine  Aged Out   COVID-19 Vaccine  Discontinued    Advanced directives: (Declined) Advance directive discussed with you today. Even though you declined this today, please call our office should you change your mind, and we can give you the proper paperwork for you to fill out. Advance Care Planning is important because it:  [x]  Makes sure you receive the medical care that is consistent with your values, goals, and preferences  [x]  It provides guidance to your family and loved ones and reduces their decisional burden about whether or not they are making the right decisions based on your wishes.  Follow the link provided in your after visit  summary or read over the paperwork we have mailed to you to help you started getting your Advance Directives in place. If you need assistance in completing these, please reach out to us  so that we can help you!  See attachments for Preventive Care and Fall Prevention Tips.

## 2023-08-22 ENCOUNTER — Other Ambulatory Visit: Payer: Self-pay | Admitting: Family Medicine

## 2023-08-22 DIAGNOSIS — I10 Essential (primary) hypertension: Secondary | ICD-10-CM

## 2023-09-28 ENCOUNTER — Ambulatory Visit (INDEPENDENT_AMBULATORY_CARE_PROVIDER_SITE_OTHER)

## 2023-09-28 ENCOUNTER — Ambulatory Visit (INDEPENDENT_AMBULATORY_CARE_PROVIDER_SITE_OTHER): Admitting: Family Medicine

## 2023-09-28 ENCOUNTER — Encounter: Payer: Self-pay | Admitting: Family Medicine

## 2023-09-28 VITALS — BP 156/87 | HR 94 | Temp 98.4°F | Ht 67.0 in | Wt 150.4 lb

## 2023-09-28 DIAGNOSIS — M48062 Spinal stenosis, lumbar region with neurogenic claudication: Secondary | ICD-10-CM

## 2023-09-28 DIAGNOSIS — N1831 Chronic kidney disease, stage 3a: Secondary | ICD-10-CM

## 2023-09-28 DIAGNOSIS — I10 Essential (primary) hypertension: Secondary | ICD-10-CM

## 2023-09-28 DIAGNOSIS — E785 Hyperlipidemia, unspecified: Secondary | ICD-10-CM | POA: Diagnosis not present

## 2023-09-28 DIAGNOSIS — M810 Age-related osteoporosis without current pathological fracture: Secondary | ICD-10-CM

## 2023-09-28 DIAGNOSIS — J069 Acute upper respiratory infection, unspecified: Secondary | ICD-10-CM

## 2023-09-28 DIAGNOSIS — J3089 Other allergic rhinitis: Secondary | ICD-10-CM

## 2023-09-28 LAB — LIPID PANEL

## 2023-09-28 MED ORDER — ALENDRONATE SODIUM 70 MG PO TABS: ORAL_TABLET | ORAL | 3 refills | Status: AC

## 2023-09-28 MED ORDER — GUAIFENESIN-CODEINE 100-10 MG/5ML PO SOLN: 5.0000 mL | Freq: Four times a day (QID) | ORAL | 0 refills | Status: AC | PRN

## 2023-09-28 MED ORDER — TELMISARTAN 20 MG PO TABS: 20.0000 mg | ORAL_TABLET | Freq: Every day | ORAL | 3 refills | Status: AC

## 2023-09-28 MED ORDER — PRAVASTATIN SODIUM 20 MG PO TABS
20.0000 mg | ORAL_TABLET | Freq: Every day | ORAL | 3 refills | Status: AC
Start: 2023-09-28 — End: ?

## 2023-09-28 MED ORDER — DAPAGLIFLOZIN PROPANEDIOL 10 MG PO TABS: 10.0000 mg | ORAL_TABLET | Freq: Every day | ORAL | 4 refills | Status: AC

## 2023-09-28 MED ORDER — MONTELUKAST SODIUM 10 MG PO TABS
10.0000 mg | ORAL_TABLET | Freq: Every day | ORAL | 3 refills | Status: AC
Start: 2023-09-28 — End: ?

## 2023-09-28 MED ORDER — AMLODIPINE BESYLATE 5 MG PO TABS: 5.0000 mg | ORAL_TABLET | Freq: Every day | ORAL | 3 refills | Status: AC

## 2023-09-28 MED ORDER — PANTOPRAZOLE SODIUM 40 MG PO TBEC: 40.0000 mg | DELAYED_RELEASE_TABLET | Freq: Every day | ORAL | 3 refills | Status: AC

## 2023-09-28 NOTE — Progress Notes (Signed)
 Subjective: CC: med refills PCP: Jolinda Vanessa HERO, DO YEP:Vanessa Pitts is a 78 y.o. female presenting to clinic today for:  1. CKD3a associated with HTN and hyperlipidemia Patient reports compliance with Farxiga .  She does not report any vaginitis or dysuria.  She does report that sometimes she feels like she has the urge to have a bowel movement but does not go.  She does suffer from constipation and wants to know if Colace is okay to take daily.  She is compliant with her ARB, Norvasc  and Pravachol   2.  Osteoporosis Compliant with Fosamax .  No reports of bony pain.  Wants to get DEXA scan done today  3.  URI She reports about a 2-week history of URI.  She denies any fevers but reports mild cough and sore throat.  She has had some mucus production in the throat but she cannot take Mucinex  because it makes her sick on her stomach.  Has been using some over-the-counter products to treat symptoms.  Has not self COVID tested but would like to know if she has it because she has a party to go to on Friday.  No hemoptysis, shortness of breath or wheezing  4.  Spinal stenosis with neurogenic claudication She reports that she thinks she is coming up due needing for another shot but she wants to hold off until she knows that she is clear COVID   ROS: Per HPI  No Known Allergies Past Medical History:  Diagnosis Date   Chronically dry eyes    Hyperlipidemia    Hypertension    Leg pain, left    Osteoporosis    Ovarian cyst     Current Outpatient Medications:    alendronate  (FOSAMAX ) 70 MG tablet, TAKE 1 TABLET BY MOUTH ONCE A WEEK WITH A FULL GLASS OF WATER ON AN EMPTY STOMACH, Disp: 12 tablet, Rfl: 3   amLODipine  (NORVASC ) 5 MG tablet, Take 1 tablet (5 mg total) by mouth daily. for blood pressure **NEEDS TO BE SEEN BEFORE NEXT REFILL**, Disp: 30 tablet, Rfl: 0   dapagliflozin  propanediol (FARXIGA ) 10 MG TABS tablet, Take 1 tablet (10 mg total) by mouth daily before breakfast., Disp: 90  tablet, Rfl: 4   fluticasone  (FLONASE ) 50 MCG/ACT nasal spray, USE 1 SPRAY(S) IN EACH NOSTRIL TWICE DAILY AS NEEDED FOR ALLERGIES OR  RHINITIS, Disp: 16 g, Rfl: 0   montelukast  (SINGULAIR ) 10 MG tablet, Take 1 tablet (10 mg total) by mouth at bedtime., Disp: 90 tablet, Rfl: 3   pantoprazole  (PROTONIX ) 40 MG tablet, Take 1 tablet (40 mg total) by mouth daily., Disp: 90 tablet, Rfl: 3   pravastatin  (PRAVACHOL ) 20 MG tablet, Take 1 tablet (20 mg total) by mouth daily., Disp: 90 tablet, Rfl: 3   telmisartan  (MICARDIS ) 20 MG tablet, Take 1 tablet (20 mg total) by mouth daily., Disp: 90 tablet, Rfl: 3   tiZANidine  (ZANAFLEX ) 4 MG tablet, Take 1 tablet (4 mg total) by mouth every 8 (eight) hours as needed for muscle spasms., Disp: 30 tablet, Rfl: 12   traMADol  (ULTRAM ) 50 MG tablet, Take 1 tablet (50 mg total) by mouth every 12 (twelve) hours as needed for moderate pain or severe pain., Disp: 60 tablet, Rfl: 1   triamcinolone  cream (KENALOG ) 0.1 %, Apply scant amount to finger and rub along ear canal twice daily for 1 week as needed for itching/ dermatitis, Disp: 30 g, Rfl: 0   vitamin B-12 (CYANOCOBALAMIN) 500 MCG tablet, 500 mcg., Disp: , Rfl:  Social History  Socioeconomic History   Marital status: Widowed    Spouse name: Marcos   Number of children: 3   Years of education: 13   Highest education level: Some college, no degree  Occupational History   Occupation: retired  Tobacco Use   Smoking status: Never   Smokeless tobacco: Never  Substance and Sexual Activity   Alcohol use: No   Drug use: No   Sexual activity: Yes    Birth control/protection: Post-menopausal  Other Topics Concern   Not on file  Social History Narrative   Husband passed away in 11/01/2019.   Pt lives with daughter.1 son and 2 daughters.    Social Drivers of Corporate investment banker Strain: Low Risk  (07/23/2023)   Overall Financial Resource Strain (CARDIA)    Difficulty of Paying Living Expenses: Not hard at all   Food Insecurity: No Food Insecurity (07/23/2023)   Hunger Vital Sign    Worried About Running Out of Food in the Last Year: Never true    Ran Out of Food in the Last Year: Never true  Transportation Needs: No Transportation Needs (07/23/2023)   PRAPARE - Administrator, Civil Service (Medical): No    Lack of Transportation (Non-Medical): No  Physical Activity: Insufficiently Active (07/23/2023)   Exercise Vital Sign    Days of Exercise per Week: 7 days    Minutes of Exercise per Session: 20 min  Stress: No Stress Concern Present (07/23/2023)   [employer] of Occupational Health - Occupational Stress Questionnaire    Feeling of Stress: Not at all  Social Connections: Socially Isolated (07/23/2023)   Social Connection and Isolation Panel    Frequency of Communication with Friends and Family: Once a week    Frequency of Social Gatherings with Friends and Family: Once a week    Attends Religious Services: Never    Database administrator or Organizations: No    Attends Banker Meetings: Never    Marital Status: Widowed  Intimate Partner Violence: Not At Risk (07/23/2023)   Humiliation, Afraid, Rape, and Kick questionnaire    Fear of Current or Ex-Partner: No    Emotionally Abused: No    Physically Abused: No    Sexually Abused: No   Family History  Problem Relation Age of Onset   Heart disease Mother    Diabetes Mother    Hyperlipidemia Mother    Hypertension Mother    Arthritis Mother    Obesity Mother    Arthritis Father     Objective: Office vital signs reviewed. BP (!) 156/87   Pulse 94   Temp 98.4 F (36.9 C)   Ht 5' 7 (1.702 m)   Wt 150 lb 6 oz (68.2 kg)   SpO2 100%   BMI 23.55 kg/m   Physical Examination:  General: Awake, alert, well nourished, No acute distress HEENT: sclera white, MMM.  Clear rhinorrhea with erythematous and edematous nasal turbinates.  Oropharynx with mild cobblestone appearance but no exudates or  ulcerations. Cardio: regular rate and rhythm, S1S2 heard, no murmurs appreciated Pulm: clear to auscultation bilaterally, no wheezes, rhonchi or rales; normal work of breathing on room air MSK: Increased kyphosis of thoracic spine.  Ambulating independently but gait slightly antalgic  Assessment/ Plan: 78 y.o. female   Essential hypertension - Plan: amLODipine  (NORVASC ) 5 MG tablet, telmisartan  (MICARDIS ) 20 MG tablet, CMP14+EGFR  Stage 3a chronic kidney disease (HCC) - Plan: dapagliflozin  propanediol (FARXIGA ) 10 MG TABS tablet, telmisartan  (MICARDIS ) 20  MG tablet, CMP14+EGFR, CBC  Age-related osteoporosis without current pathological fracture - Plan: CMP14+EGFR, VITAMIN D  25 Hydroxy (Vit-D Deficiency, Fractures), DG [facility] DEXA  Hyperlipidemia LDL goal <130 - Plan: pravastatin  (PRAVACHOL ) 20 MG tablet, CMP14+EGFR, Lipid Panel  Non-seasonal allergic rhinitis, unspecified trigger - Plan: montelukast  (SINGULAIR ) 10 MG tablet, CMP14+EGFR  URI with cough and congestion - Plan: Novel Coronavirus, NAA ([facility]), guaiFENesin -codeine  100-10 MG/5ML syrup  Spinal stenosis, lumbar region with neurogenic claudication  BP not at goal.  Meds renewed.  She is taking meds for URI, so I wonder if maybe she has some med induced elevations in BP.  Cough syrup sent.  COVID testing ordered.  She is outside the window of treatment.  DEXA scan.  Fosamax  renewed.  Nonfasting lipid. Contune pravastatin   Written rx for caudal epidural given to [name] for faxing to Dr Derrill.   Vanessa CHRISTELLA Fielding, DO [facility] [phone]

## 2023-09-29 ENCOUNTER — Ambulatory Visit: Payer: Self-pay | Admitting: Family Medicine

## 2023-09-29 LAB — CMP14+EGFR
ALT: 16 IU/L (ref 0–32)
AST: 20 IU/L (ref 0–40)
Albumin: 4.6 g/dL (ref 3.8–4.8)
Alkaline Phosphatase: 55 IU/L (ref 44–121)
BUN/Creatinine Ratio: 13 (ref 12–28)
BUN: 14 mg/dL (ref 8–27)
Bilirubin Total: 0.3 mg/dL (ref 0.0–1.2)
CO2: 24 mmol/L (ref 20–29)
Calcium: 9.8 mg/dL (ref 8.7–10.3)
Chloride: 99 mmol/L (ref 96–106)
Creatinine, Ser: 1.05 mg/dL — AB (ref 0.57–1.00)
Globulin, Total: 2.2 g/dL (ref 1.5–4.5)
Glucose: 98 mg/dL (ref 70–99)
Potassium: 4.6 mmol/L (ref 3.5–5.2)
Sodium: 138 mmol/L (ref 134–144)
Total Protein: 6.8 g/dL (ref 6.0–8.5)
eGFR: 55 mL/min/1.73 — AB (ref >59)

## 2023-09-29 LAB — CBC
Hematocrit: 43.3 % (ref 34.0–46.6)
Hemoglobin: 14.3 g/dL (ref 11.1–15.9)
MCH: 29.6 pg (ref 26.6–33.0)
MCHC: 33 g/dL (ref 31.5–35.7)
MCV: 90 fL (ref 79–97)
Platelets: 292 x10E3/uL (ref 150–450)
RBC: 4.83 x10E6/uL (ref 3.77–5.28)
RDW: 12.6 % (ref 11.7–15.4)
WBC: 5.8 x10E3/uL (ref 3.4–10.8)

## 2023-09-29 LAB — LIPID PANEL
Cholesterol, Total: 163 mg/dL (ref 100–199)
HDL: 71 mg/dL (ref >39)
LDL CALC COMMENT:: 2.3 ratio (ref 0.0–4.4)
LDL Chol Calc (NIH): 80 mg/dL (ref 0–99)
Triglycerides: 62 mg/dL (ref 0–149)
VLDL Cholesterol Cal: 12 mg/dL (ref 5–40)

## 2023-09-29 LAB — VITAMIN D 25 HYDROXY (VIT D DEFICIENCY, FRACTURES): Vit D, 25-Hydroxy: 61.5 ng/mL (ref 30.0–100.0)

## 2023-09-30 LAB — NOVEL CORONAVIRUS, NAA: SARS-CoV-2, NAA: NOT DETECTED

## 2023-10-01 ENCOUNTER — Ambulatory Visit: Payer: Self-pay

## 2023-10-01 NOTE — Telephone Encounter (Signed)
 Call disconnected prior to transfer. Per chart, patient was able to get connected with another RN.   Copied from CRM [id number]. Topic: Clinical - Lab/Test Results >> Oct 01, 2023 10:33 AM Wess RAMAN wrote: Reason for CRM: Patient has further question about labs

## 2023-10-01 NOTE — Telephone Encounter (Signed)
 Called and spoke with patient and made her aware that she does need to keep taking her Farxiga  Rx because the Farxiga  reduces the risk of renal function decline. Patient voiced understanding.

## 2023-10-01 NOTE — Telephone Encounter (Signed)
 FYI Only or Action Required?: Action required by provider: Patient requesting a call back regarding medication question.  Patient was last seen in primary care on 09/28/2023 by Jolinda Norene HERO, DO.  Called Nurse Triage reporting Advice Only.  Symptoms began N/A.  Interventions attempted: Nothing.  Symptoms are: N/A.  Triage Disposition: Call PCP When Office is Open  Patient/caregiver understands and will follow disposition?: Yes               Reason for Disposition  [1] Caller requesting NON-URGENT health information AND [2] PCP's office is the best resource  Answer Assessment - Initial Assessment Questions Patient requesting a call back at [phone] after 2 pm today regarding her medication. Will route to office for follow up     1. REASON FOR CALL: What is the main reason for your call? or How can I best help you?     Patient called requesting results for recent testing. Pt made aware that her Covid test was negative. Pt states she is unable to get into her MyChart messages and MyChart messages read to her that were sent by her provider this morning. Read Bone density shows osteoporosis.  Continue Fosamax  for treatment of bones, and Liver enzymes and electrolytes are normal. Cholesterol is fairly unchanged from previous check up. Patient verbalized understanding of results that were read to her.  2. SYMPTOMS : Do you have any symptoms?      Denied 3. OTHER QUESTIONS: Do you have any other questions?     Pt states she has questions about if she still needs to continue taking her Farxiga  medication.  Protocols used: Information Only Call - No Triage-A-AH

## 2023-10-12 ENCOUNTER — Telehealth: Payer: Self-pay | Admitting: Family Medicine

## 2023-10-12 NOTE — Telephone Encounter (Signed)
 Noted

## 2023-10-12 NOTE — Telephone Encounter (Signed)
 Copied from CRM [id number]. Topic: Clinical - Lab/Test Results >> Oct 01, 2023 10:33 AM Wess RAMAN wrote: Reason for CRM: Patient has further question about labs >> Oct 12, 2023  4:29 PM Fredrica W wrote: Patient returned missed call to [facility]. Called [facility] - no answer. Patient states she has spoken to someone about results already. For Bone Density and Covid.  Thank you

## 2023-10-12 NOTE — Telephone Encounter (Signed)
 3rd attempt to contact patient to give her lab results. No answer. Left detailed message with negative result and advised for her to call us  if she had any questions.

## 2023-10-22 ENCOUNTER — Telehealth: Payer: Self-pay | Admitting: Family Medicine

## 2023-10-22 NOTE — Telephone Encounter (Signed)
 Copied from CRM #[id number]. Topic: Referral - Request for Referral >> Oct 22, 2023  2:24 PM Nathanel BROCKS wrote: Did the patient discuss referral with their provider in the last year? Yes (If No - schedule appointment) (If Yes - send message)  Appointment offered? No  Type of order/referral and detailed reason for visit: requesting a shot for leg  Preference of office, provider, location: Dr Derrill at [facility]  If referral order, have you been seen by this specialty before? Yes (If Yes, this issue or another issue? When? Where?[facility]  Can we respond through MyChart? No

## 2023-10-25 NOTE — Telephone Encounter (Signed)
 [name], you have this signed order already. It just needs to be faxed.

## 2023-10-25 NOTE — Telephone Encounter (Signed)
 Faxed order

## 2023-10-26 ENCOUNTER — Other Ambulatory Visit: Payer: Self-pay | Admitting: Family Medicine

## 2023-10-26 DIAGNOSIS — M47816 Spondylosis without myelopathy or radiculopathy, lumbar region: Secondary | ICD-10-CM

## 2023-10-26 DIAGNOSIS — R29818 Other symptoms and signs involving the nervous system: Secondary | ICD-10-CM

## 2023-11-09 ENCOUNTER — Other Ambulatory Visit: Payer: Self-pay | Admitting: Family Medicine

## 2023-11-09 NOTE — Telephone Encounter (Unsigned)
 Copied from CRM [id number]. Topic: Clinical - Medication Refill >> Nov 09, 2023  3:19 PM Everette C wrote: Medication: alendronate  (FOSAMAX ) 70 MG tablet  Has the patient contacted their pharmacy? Yes (Agent: If no, request that the patient contact the pharmacy for the refill. If patient does not wish to contact the pharmacy document the reason why and proceed with request.) (Agent: If yes, when and what did the pharmacy advise?)  This is the patient's preferred pharmacy:  [id number] - [place], [place] - [address] [place] [address] Phone: [phone] Fax: [phone]  Is this the correct pharmacy for this prescription? Yes If no, delete pharmacy and type the correct one.   Has the prescription been filled recently? Yes  Is the patient out of the medication? Yes  Has the patient been seen for an appointment in the last year OR does the patient have an upcoming appointment? Yes  Can we respond through MyChart? No  Agent: Please be advised that Rx refills may take up to 3 business days. We ask that you follow-up with your pharmacy.

## 2023-11-10 NOTE — Telephone Encounter (Signed)
 Was just sent last month x1 year supply. Does this need to go to a different pharmacy?

## 2023-11-11 NOTE — Telephone Encounter (Signed)
 LMOVM refill ws sent in on 09/28/23 for a years worth.

## 2023-11-18 NOTE — Discharge Instructions (Signed)

## 2023-11-19 ENCOUNTER — Inpatient Hospital Stay
Admission: RE | Admit: 2023-11-19 | Discharge: 2023-11-19 | Disposition: A | Discharge status: Home / Self Care | Source: Ambulatory Visit | Attending: Family Medicine | Admitting: Family Medicine

## 2023-11-26 NOTE — Discharge Instructions (Signed)

## 2023-11-29 ENCOUNTER — Ambulatory Visit
Admission: RE | Admit: 2023-11-29 | Discharge: 2023-11-29 | Disposition: A | Discharge status: Home / Self Care | Source: Ambulatory Visit | Attending: Family Medicine | Admitting: Family Medicine

## 2023-11-29 DIAGNOSIS — M47816 Spondylosis without myelopathy or radiculopathy, lumbar region: Secondary | ICD-10-CM

## 2023-11-29 DIAGNOSIS — R29818 Other symptoms and signs involving the nervous system: Secondary | ICD-10-CM

## 2023-11-29 MED ORDER — IOPAMIDOL (ISOVUE-M 200) INJECTION 41%
1.0000 mL | Freq: Once | INTRAMUSCULAR | Status: AC
  Administered 2023-11-29: 1 mL via EPIDURAL

## 2023-11-29 MED ORDER — METHYLPREDNISOLONE ACETATE 40 MG/ML INJ SUSP (RADIOLOG
80.0000 mg | Freq: Once | INTRAMUSCULAR | Status: AC
  Administered 2023-11-29: 80 mg via EPIDURAL

## 2024-02-07 ENCOUNTER — Telehealth: Payer: Self-pay | Admitting: Family Medicine

## 2024-02-07 NOTE — Telephone Encounter (Unsigned)
 Copied from CRM [id number]. Topic: Clinical - Request for Lab/Test Order >> Feb 07, 2024  2:07 PM Susanna ORN wrote: Reason for CRM: Patient called in to request that Dr. Jolinda fax the information to Dr. Derrill at [facility] so that she can get her shot in her leg.

## 2024-02-08 NOTE — Telephone Encounter (Signed)
Given to kim. 

## 2024-02-08 NOTE — Telephone Encounter (Signed)
 Fax sent to Dr. Veria office.

## 2024-02-09 ENCOUNTER — Other Ambulatory Visit: Payer: Self-pay | Admitting: Family Medicine

## 2024-02-09 DIAGNOSIS — M48061 Spinal stenosis, lumbar region without neurogenic claudication: Secondary | ICD-10-CM

## 2024-02-21 ENCOUNTER — Telehealth: Payer: Self-pay | Admitting: Family Medicine

## 2024-02-21 NOTE — Telephone Encounter (Signed)
 Copied from CRM #[id number]. Topic: Clinical - Prescription Issue >> Feb 21, 2024  2:17 PM Graeme ORN wrote: Reason for CRM: Patient called. States [employer] requires she uses preferred pharmacy [facility] [address]. [place], [place] [address]. Did not want to request any refills at this time. Just wanted to notify provider of change. Thank You

## 2024-02-21 NOTE — Discharge Instructions (Signed)

## 2024-02-21 NOTE — Telephone Encounter (Signed)
"  Updated pharmacy in chart.   "

## 2024-02-22 ENCOUNTER — Inpatient Hospital Stay
Admission: RE | Admit: 2024-02-22 | Discharge: 2024-02-22 | Disposition: A | Source: Ambulatory Visit | Attending: Family Medicine

## 2024-02-22 DIAGNOSIS — M48061 Spinal stenosis, lumbar region without neurogenic claudication: Secondary | ICD-10-CM

## 2024-02-22 MED ORDER — IOPAMIDOL (ISOVUE-M 200) INJECTION 41%
1.0000 mL | Freq: Once | INTRAMUSCULAR | Status: AC
  Administered 2024-02-22: 1 mL via EPIDURAL

## 2024-02-22 MED ORDER — METHYLPREDNISOLONE ACETATE 40 MG/ML INJ SUSP (RADIOLOG
80.0000 mg | Freq: Once | INTRAMUSCULAR | Status: AC
  Administered 2024-02-22: 80 mg via EPIDURAL

## 2024-07-25 ENCOUNTER — Ambulatory Visit: Payer: Self-pay
